# Patient Record
Sex: Female | Born: 1945 | Race: White | Hispanic: No | Marital: Married | Smoking: Current every day smoker
Health system: Southern US, Community
[De-identification: ages and names within clinical notes are randomized; demographics above are authoritative.]

## PROBLEM LIST (undated history)

## (undated) DIAGNOSIS — M069 Rheumatoid arthritis, unspecified: Secondary | ICD-10-CM

## (undated) DIAGNOSIS — Z8639 Personal history of other endocrine, nutritional and metabolic disease: Secondary | ICD-10-CM

## (undated) DIAGNOSIS — M1990 Unspecified osteoarthritis, unspecified site: Secondary | ICD-10-CM

## (undated) DIAGNOSIS — F32A Depression, unspecified: Secondary | ICD-10-CM

## (undated) DIAGNOSIS — K5790 Diverticulosis of intestine, part unspecified, without perforation or abscess without bleeding: Secondary | ICD-10-CM

## (undated) DIAGNOSIS — IMO0001 Reserved for inherently not codable concepts without codable children: Secondary | ICD-10-CM

## (undated) DIAGNOSIS — Z9884 Bariatric surgery status: Secondary | ICD-10-CM

## (undated) DIAGNOSIS — K862 Cyst of pancreas: Secondary | ICD-10-CM

## (undated) DIAGNOSIS — J449 Chronic obstructive pulmonary disease, unspecified: Secondary | ICD-10-CM

## (undated) DIAGNOSIS — H269 Unspecified cataract: Secondary | ICD-10-CM

## (undated) DIAGNOSIS — I10 Essential (primary) hypertension: Secondary | ICD-10-CM

## (undated) DIAGNOSIS — F17200 Nicotine dependence, unspecified, uncomplicated: Secondary | ICD-10-CM

## (undated) DIAGNOSIS — G709 Myoneural disorder, unspecified: Secondary | ICD-10-CM

## (undated) DIAGNOSIS — H9190 Unspecified hearing loss, unspecified ear: Secondary | ICD-10-CM

## (undated) DIAGNOSIS — D179 Benign lipomatous neoplasm, unspecified: Secondary | ICD-10-CM

## (undated) DIAGNOSIS — Z905 Acquired absence of kidney: Secondary | ICD-10-CM

## (undated) DIAGNOSIS — K219 Gastro-esophageal reflux disease without esophagitis: Secondary | ICD-10-CM

## (undated) DIAGNOSIS — F329 Major depressive disorder, single episode, unspecified: Secondary | ICD-10-CM

## (undated) HISTORY — DX: Reserved for inherently not codable concepts without codable children: IMO0001

## (undated) HISTORY — DX: Gastro-esophageal reflux disease without esophagitis: K21.9

## (undated) HISTORY — DX: Acquired absence of kidney: Z90.5

## (undated) HISTORY — DX: Major depressive disorder, single episode, unspecified: F32.9

## (undated) HISTORY — DX: Rheumatoid arthritis, unspecified: M06.9

## (undated) HISTORY — DX: Essential (primary) hypertension: I10

## (undated) HISTORY — DX: Bariatric surgery status: Z98.84

## (undated) HISTORY — DX: Nicotine dependence, unspecified, uncomplicated: F17.200

## (undated) HISTORY — DX: Unspecified osteoarthritis, unspecified site: M19.90

## (undated) HISTORY — DX: Unspecified hearing loss, unspecified ear: H91.90

## (undated) HISTORY — DX: Unspecified cataract: H26.9

## (undated) HISTORY — PX: EYE SURGERY: SHX253

## (undated) HISTORY — DX: Myoneural disorder, unspecified: G70.9

## (undated) HISTORY — DX: Benign lipomatous neoplasm, unspecified: D17.9

## (undated) HISTORY — DX: Depression, unspecified: F32.A

## (undated) HISTORY — DX: Cyst of pancreas: K86.2

## (undated) HISTORY — DX: Personal history of other endocrine, nutritional and metabolic disease: Z86.39

## (undated) HISTORY — DX: Diverticulosis of intestine, part unspecified, without perforation or abscess without bleeding: K57.90

---

## 1971-06-15 HISTORY — PX: ABDOMINAL HYSTERECTOMY: SHX81

## 1975-06-15 DIAGNOSIS — Z905 Acquired absence of kidney: Secondary | ICD-10-CM

## 1975-06-15 HISTORY — PX: NEPHRECTOMY: SHX65

## 1975-06-15 HISTORY — DX: Acquired absence of kidney: Z90.5

## 1997-12-06 ENCOUNTER — Other Ambulatory Visit: Admission: RE | Admit: 1997-12-06 | Discharge: 1997-12-06 | Payer: Self-pay | Admitting: Obstetrics and Gynecology

## 1999-12-04 ENCOUNTER — Encounter: Admission: RE | Admit: 1999-12-04 | Discharge: 1999-12-04 | Payer: Self-pay | Admitting: Obstetrics and Gynecology

## 1999-12-04 ENCOUNTER — Encounter: Payer: Self-pay | Admitting: Obstetrics and Gynecology

## 2000-07-19 ENCOUNTER — Other Ambulatory Visit: Admission: RE | Admit: 2000-07-19 | Discharge: 2000-07-19 | Payer: Self-pay | Admitting: Obstetrics and Gynecology

## 2000-07-27 ENCOUNTER — Encounter: Payer: Self-pay | Admitting: Family Medicine

## 2000-07-27 ENCOUNTER — Ambulatory Visit (HOSPITAL_COMMUNITY): Admission: RE | Admit: 2000-07-27 | Discharge: 2000-07-27 | Payer: Self-pay | Admitting: Family Medicine

## 2001-01-29 ENCOUNTER — Emergency Department (HOSPITAL_COMMUNITY): Admission: EM | Admit: 2001-01-29 | Discharge: 2001-01-30 | Payer: Self-pay

## 2001-12-12 ENCOUNTER — Ambulatory Visit (HOSPITAL_COMMUNITY): Admission: RE | Admit: 2001-12-12 | Discharge: 2001-12-12 | Payer: Self-pay

## 2001-12-12 ENCOUNTER — Emergency Department (HOSPITAL_COMMUNITY): Admission: EM | Admit: 2001-12-12 | Discharge: 2001-12-12 | Payer: Self-pay | Admitting: Emergency Medicine

## 2001-12-29 ENCOUNTER — Encounter: Admission: RE | Admit: 2001-12-29 | Discharge: 2001-12-29 | Payer: Self-pay

## 2003-04-03 ENCOUNTER — Encounter: Payer: Self-pay | Admitting: Obstetrics and Gynecology

## 2003-04-03 ENCOUNTER — Encounter: Admission: RE | Admit: 2003-04-03 | Discharge: 2003-04-03 | Payer: Self-pay | Admitting: Obstetrics and Gynecology

## 2003-04-10 ENCOUNTER — Emergency Department (HOSPITAL_COMMUNITY): Admission: EM | Admit: 2003-04-10 | Discharge: 2003-04-11 | Payer: Self-pay | Admitting: Emergency Medicine

## 2003-06-15 DIAGNOSIS — Z8639 Personal history of other endocrine, nutritional and metabolic disease: Secondary | ICD-10-CM

## 2003-06-15 HISTORY — DX: Personal history of other endocrine, nutritional and metabolic disease: Z86.39

## 2003-12-11 ENCOUNTER — Emergency Department (HOSPITAL_COMMUNITY): Admission: EM | Admit: 2003-12-11 | Discharge: 2003-12-11 | Payer: Self-pay | Admitting: Emergency Medicine

## 2003-12-13 ENCOUNTER — Ambulatory Visit (HOSPITAL_COMMUNITY): Admission: RE | Admit: 2003-12-13 | Discharge: 2003-12-13 | Payer: Self-pay | Admitting: Gastroenterology

## 2004-06-05 ENCOUNTER — Ambulatory Visit: Payer: Self-pay | Admitting: Gastroenterology

## 2005-05-03 ENCOUNTER — Ambulatory Visit: Payer: Self-pay | Admitting: Cardiology

## 2005-05-18 ENCOUNTER — Ambulatory Visit (HOSPITAL_COMMUNITY): Admission: RE | Admit: 2005-05-18 | Discharge: 2005-05-18 | Payer: Self-pay

## 2005-05-28 ENCOUNTER — Ambulatory Visit: Payer: Self-pay

## 2005-06-16 ENCOUNTER — Ambulatory Visit: Payer: Self-pay | Admitting: Cardiology

## 2005-07-12 ENCOUNTER — Ambulatory Visit: Payer: Self-pay | Admitting: Gastroenterology

## 2005-08-04 ENCOUNTER — Ambulatory Visit: Payer: Self-pay

## 2005-09-02 ENCOUNTER — Inpatient Hospital Stay: Payer: Self-pay | Admitting: Psychiatry

## 2005-12-14 ENCOUNTER — Ambulatory Visit (HOSPITAL_COMMUNITY): Admission: RE | Admit: 2005-12-14 | Discharge: 2005-12-14 | Payer: Self-pay

## 2006-02-01 ENCOUNTER — Ambulatory Visit: Payer: Self-pay | Admitting: Gastroenterology

## 2006-02-03 ENCOUNTER — Ambulatory Visit: Payer: Self-pay | Admitting: Gastroenterology

## 2006-02-03 ENCOUNTER — Encounter (INDEPENDENT_AMBULATORY_CARE_PROVIDER_SITE_OTHER): Payer: Self-pay | Admitting: Specialist

## 2006-02-17 ENCOUNTER — Ambulatory Visit (HOSPITAL_COMMUNITY): Admission: RE | Admit: 2006-02-17 | Discharge: 2006-02-17 | Payer: Self-pay | Admitting: Gastroenterology

## 2006-02-22 ENCOUNTER — Ambulatory Visit: Payer: Self-pay | Admitting: Gastroenterology

## 2006-03-15 ENCOUNTER — Ambulatory Visit: Payer: Self-pay | Admitting: Gastroenterology

## 2006-04-13 ENCOUNTER — Ambulatory Visit: Payer: Self-pay | Admitting: Gastroenterology

## 2007-06-15 HISTORY — PX: GASTRIC BYPASS: SHX52

## 2008-03-25 ENCOUNTER — Ambulatory Visit: Payer: Self-pay | Admitting: Cardiology

## 2008-03-26 ENCOUNTER — Encounter: Payer: Self-pay | Admitting: Cardiology

## 2008-03-26 ENCOUNTER — Ambulatory Visit: Payer: Self-pay | Admitting: Surgery

## 2008-03-26 ENCOUNTER — Inpatient Hospital Stay (HOSPITAL_COMMUNITY): Admission: EM | Admit: 2008-03-26 | Discharge: 2008-03-27 | Payer: Self-pay | Admitting: Emergency Medicine

## 2008-03-28 ENCOUNTER — Ambulatory Visit: Payer: Self-pay

## 2008-04-12 ENCOUNTER — Ambulatory Visit: Payer: Self-pay | Admitting: Cardiology

## 2008-05-20 ENCOUNTER — Ambulatory Visit: Admission: RE | Admit: 2008-05-20 | Discharge: 2008-05-20 | Payer: Self-pay | Admitting: Cardiology

## 2008-10-14 ENCOUNTER — Ambulatory Visit (HOSPITAL_COMMUNITY): Admission: RE | Admit: 2008-10-14 | Discharge: 2008-10-14 | Payer: Self-pay | Admitting: Surgery

## 2008-10-18 DIAGNOSIS — R079 Chest pain, unspecified: Secondary | ICD-10-CM | POA: Insufficient documentation

## 2008-10-18 DIAGNOSIS — I10 Essential (primary) hypertension: Secondary | ICD-10-CM | POA: Insufficient documentation

## 2008-10-18 DIAGNOSIS — Z905 Acquired absence of kidney: Secondary | ICD-10-CM | POA: Insufficient documentation

## 2008-10-18 DIAGNOSIS — Z8639 Personal history of other endocrine, nutritional and metabolic disease: Secondary | ICD-10-CM | POA: Insufficient documentation

## 2008-10-22 ENCOUNTER — Ambulatory Visit (HOSPITAL_COMMUNITY): Admission: RE | Admit: 2008-10-22 | Discharge: 2008-10-22 | Payer: Self-pay | Admitting: Surgery

## 2008-11-06 ENCOUNTER — Encounter: Admission: RE | Admit: 2008-11-06 | Discharge: 2008-11-06 | Payer: Self-pay | Admitting: Surgery

## 2009-05-01 ENCOUNTER — Encounter: Admission: RE | Admit: 2009-05-01 | Discharge: 2009-06-11 | Payer: Self-pay | Admitting: Surgery

## 2009-05-13 ENCOUNTER — Inpatient Hospital Stay (HOSPITAL_COMMUNITY): Admission: RE | Admit: 2009-05-13 | Discharge: 2009-05-20 | Payer: Self-pay | Admitting: Surgery

## 2009-05-14 ENCOUNTER — Ambulatory Visit: Payer: Self-pay | Admitting: Vascular Surgery

## 2009-05-14 ENCOUNTER — Encounter (INDEPENDENT_AMBULATORY_CARE_PROVIDER_SITE_OTHER): Payer: Self-pay | Admitting: Surgery

## 2009-06-14 DIAGNOSIS — K862 Cyst of pancreas: Secondary | ICD-10-CM

## 2009-06-14 DIAGNOSIS — D179 Benign lipomatous neoplasm, unspecified: Secondary | ICD-10-CM

## 2009-06-14 HISTORY — DX: Benign lipomatous neoplasm, unspecified: D17.9

## 2009-06-14 HISTORY — DX: Cyst of pancreas: K86.2

## 2009-07-31 ENCOUNTER — Encounter: Admission: RE | Admit: 2009-07-31 | Discharge: 2009-07-31 | Payer: Self-pay | Admitting: Surgery

## 2009-11-03 ENCOUNTER — Encounter: Admission: RE | Admit: 2009-11-03 | Discharge: 2009-11-03 | Payer: Self-pay | Admitting: Surgery

## 2009-12-23 ENCOUNTER — Inpatient Hospital Stay (HOSPITAL_COMMUNITY): Admission: AD | Admit: 2009-12-23 | Discharge: 2009-12-24 | Payer: Self-pay | Admitting: Surgery

## 2009-12-23 ENCOUNTER — Encounter: Payer: Self-pay | Admitting: Emergency Medicine

## 2010-06-29 ENCOUNTER — Encounter
Admission: RE | Admit: 2010-06-29 | Discharge: 2010-07-14 | Payer: Self-pay | Source: Home / Self Care | Attending: Surgery | Admitting: Surgery

## 2010-08-30 LAB — CBC
HCT: 40.7 % (ref 36.0–46.0)
Hemoglobin: 12.1 g/dL (ref 12.0–15.0)
MCH: 29.3 pg (ref 26.0–34.0)
MCV: 86.3 fL (ref 78.0–100.0)
Platelets: 159 10*3/uL (ref 150–400)
Platelets: 167 10*3/uL (ref 150–400)
RBC: 4.71 MIL/uL (ref 3.87–5.11)
RBC: 4.15 MIL/uL (ref 3.87–5.11)
WBC: 6.6 10*3/uL (ref 4.0–10.5)

## 2010-08-30 LAB — DIFFERENTIAL
Basophils Absolute: 0.1 10*3/uL (ref 0.0–0.1)
Basophils Relative: 1 % (ref 0–1)
Lymphocytes Relative: 9 % — ABNORMAL LOW (ref 12–46)
Lymphocytes Relative: 28 % (ref 12–46)
Lymphs Abs: 1.8 10*3/uL (ref 0.7–4.0)
Monocytes Absolute: 0.4 10*3/uL (ref 0.1–1.0)
Monocytes Relative: 7 % (ref 3–12)
Neutro Abs: 8.6 10*3/uL — ABNORMAL HIGH (ref 1.7–7.7)
Neutro Abs: 4.1 10*3/uL (ref 1.7–7.7)
Neutrophils Relative %: 86 % — ABNORMAL HIGH (ref 43–77)
Neutrophils Relative %: 63 % (ref 43–77)

## 2010-08-30 LAB — COMPREHENSIVE METABOLIC PANEL
Albumin: 3.9 g/dL (ref 3.5–5.2)
BUN: 18 mg/dL (ref 6–23)
Chloride: 96 mEq/L (ref 96–112)
Creatinine, Ser: 0.95 mg/dL (ref 0.4–1.2)
GFR calc non Af Amer: 59 mL/min — ABNORMAL LOW (ref >60)
Glucose, Bld: 154 mg/dL — ABNORMAL HIGH (ref 70–99)
Total Bilirubin: 0.6 mg/dL (ref 0.3–1.2)

## 2010-08-30 LAB — URINALYSIS, ROUTINE W REFLEX MICROSCOPIC
Glucose, UA: NEGATIVE mg/dL
Hgb urine dipstick: NEGATIVE
Ketones, ur: 15 mg/dL — AB
Protein, ur: 30 mg/dL — AB

## 2010-08-30 LAB — URINE MICROSCOPIC-ADD ON

## 2010-08-30 LAB — LIPASE, BLOOD: Lipase: 26 U/L (ref 11–59)

## 2010-08-30 LAB — URINE CULTURE

## 2010-09-15 LAB — GLUCOSE, CAPILLARY
Glucose-Capillary: 126 mg/dL — ABNORMAL HIGH (ref 70–99)
Glucose-Capillary: 133 mg/dL — ABNORMAL HIGH (ref 70–99)
Glucose-Capillary: 133 mg/dL — ABNORMAL HIGH (ref 70–99)
Glucose-Capillary: 133 mg/dL — ABNORMAL HIGH (ref 70–99)
Glucose-Capillary: 135 mg/dL — ABNORMAL HIGH (ref 70–99)
Glucose-Capillary: 140 mg/dL — ABNORMAL HIGH (ref 70–99)
Glucose-Capillary: 142 mg/dL — ABNORMAL HIGH (ref 70–99)
Glucose-Capillary: 142 mg/dL — ABNORMAL HIGH (ref 70–99)
Glucose-Capillary: 146 mg/dL — ABNORMAL HIGH (ref 70–99)
Glucose-Capillary: 146 mg/dL — ABNORMAL HIGH (ref 70–99)
Glucose-Capillary: 147 mg/dL — ABNORMAL HIGH (ref 70–99)
Glucose-Capillary: 152 mg/dL — ABNORMAL HIGH (ref 70–99)
Glucose-Capillary: 153 mg/dL — ABNORMAL HIGH (ref 70–99)
Glucose-Capillary: 156 mg/dL — ABNORMAL HIGH (ref 70–99)
Glucose-Capillary: 156 mg/dL — ABNORMAL HIGH (ref 70–99)
Glucose-Capillary: 157 mg/dL — ABNORMAL HIGH (ref 70–99)
Glucose-Capillary: 157 mg/dL — ABNORMAL HIGH (ref 70–99)
Glucose-Capillary: 158 mg/dL — ABNORMAL HIGH (ref 70–99)
Glucose-Capillary: 164 mg/dL — ABNORMAL HIGH (ref 70–99)
Glucose-Capillary: 169 mg/dL — ABNORMAL HIGH (ref 70–99)

## 2010-09-15 LAB — DIFFERENTIAL
Basophils Absolute: 0 10*3/uL (ref 0.0–0.1)
Basophils Absolute: 0 10*3/uL (ref 0.0–0.1)
Basophils Absolute: 0 10*3/uL (ref 0.0–0.1)
Basophils Relative: 0 % (ref 0–1)
Basophils Relative: 0 % (ref 0–1)
Eosinophils Absolute: 0 10*3/uL (ref 0.0–0.7)
Eosinophils Absolute: 0.1 10*3/uL (ref 0.0–0.7)
Lymphocytes Relative: 12 % (ref 12–46)
Lymphocytes Relative: 6 % — ABNORMAL LOW (ref 12–46)
Lymphocytes Relative: 6 % — ABNORMAL LOW (ref 12–46)
Lymphs Abs: 0.5 10*3/uL — ABNORMAL LOW (ref 0.7–4.0)
Lymphs Abs: 0.6 10*3/uL — ABNORMAL LOW (ref 0.7–4.0)
Lymphs Abs: 1.1 10*3/uL (ref 0.7–4.0)
Monocytes Absolute: 0.5 10*3/uL (ref 0.1–1.0)
Monocytes Absolute: 0.5 10*3/uL (ref 0.1–1.0)
Monocytes Relative: 6 % (ref 3–12)
Monocytes Relative: 8 % (ref 3–12)
Neutro Abs: 6.2 10*3/uL (ref 1.7–7.7)
Neutro Abs: 8.7 10*3/uL — ABNORMAL HIGH (ref 1.7–7.7)
Neutro Abs: 9 10*3/uL — ABNORMAL HIGH (ref 1.7–7.7)
Neutrophils Relative %: 77 % (ref 43–77)
Neutrophils Relative %: 83 % — ABNORMAL HIGH (ref 43–77)
Neutrophils Relative %: 89 % — ABNORMAL HIGH (ref 43–77)
Neutrophils Relative %: 90 % — ABNORMAL HIGH (ref 43–77)

## 2010-09-15 LAB — BASIC METABOLIC PANEL
BUN: 3 mg/dL — ABNORMAL LOW (ref 6–23)
BUN: 6 mg/dL (ref 6–23)
CO2: 31 mEq/L (ref 19–32)
CO2: 33 mEq/L — ABNORMAL HIGH (ref 19–32)
Calcium: 8.4 mg/dL (ref 8.4–10.5)
Calcium: 9.1 mg/dL (ref 8.4–10.5)
Chloride: 97 mEq/L (ref 96–112)
Chloride: 99 mEq/L (ref 96–112)
Creatinine, Ser: 0.62 mg/dL (ref 0.4–1.2)
Creatinine, Ser: 0.69 mg/dL (ref 0.4–1.2)
Creatinine, Ser: 0.71 mg/dL (ref 0.4–1.2)
GFR calc Af Amer: >60 mL/min (ref >60)
GFR calc Af Amer: >60 mL/min (ref >60)
GFR calc non Af Amer: >60 mL/min (ref >60)
GFR calc non Af Amer: >60 mL/min (ref >60)
Glucose, Bld: 164 mg/dL — ABNORMAL HIGH (ref 70–99)
Glucose, Bld: 175 mg/dL — ABNORMAL HIGH (ref 70–99)
Potassium: 3.4 mEq/L — ABNORMAL LOW (ref 3.5–5.1)
Potassium: 3.6 mEq/L (ref 3.5–5.1)
Sodium: 135 mEq/L (ref 135–145)

## 2010-09-15 LAB — CBC
HCT: 37.2 % (ref 36.0–46.0)
Hemoglobin: 12.5 g/dL (ref 12.0–15.0)
Hemoglobin: 13.2 g/dL (ref 12.0–15.0)
MCHC: 31.9 g/dL (ref 30.0–36.0)
MCHC: 32.1 g/dL (ref 30.0–36.0)
MCHC: 32.1 g/dL (ref 30.0–36.0)
MCHC: 32.3 g/dL (ref 30.0–36.0)
MCV: 84.5 fL (ref 78.0–100.0)
MCV: 85.2 fL (ref 78.0–100.0)
MCV: 85.5 fL (ref 78.0–100.0)
Platelets: 176 10*3/uL (ref 150–400)
Platelets: 180 10*3/uL (ref 150–400)
Platelets: 188 10*3/uL (ref 150–400)
Platelets: 191 10*3/uL (ref 150–400)
RBC: 4.32 MIL/uL (ref 3.87–5.11)
RBC: 4.4 MIL/uL (ref 3.87–5.11)
RBC: 4.8 MIL/uL (ref 3.87–5.11)
RDW: 15.7 % — ABNORMAL HIGH (ref 11.5–15.5)
RDW: 15.8 % — ABNORMAL HIGH (ref 11.5–15.5)
RDW: 16 % — ABNORMAL HIGH (ref 11.5–15.5)
RDW: 16.3 % — ABNORMAL HIGH (ref 11.5–15.5)
WBC: 4.1 10*3/uL (ref 4.0–10.5)
WBC: 4.7 10*3/uL (ref 4.0–10.5)
WBC: 9.6 10*3/uL (ref 4.0–10.5)
WBC: 9.7 10*3/uL (ref 4.0–10.5)

## 2010-09-15 LAB — COMPREHENSIVE METABOLIC PANEL
ALT: 54 U/L — ABNORMAL HIGH (ref 0–35)
Albumin: 3.1 g/dL — ABNORMAL LOW (ref 3.5–5.2)
Calcium: 8.3 mg/dL — ABNORMAL LOW (ref 8.4–10.5)
Glucose, Bld: 166 mg/dL — ABNORMAL HIGH (ref 70–99)
Sodium: 132 mEq/L — ABNORMAL LOW (ref 135–145)
Total Protein: 6.5 g/dL (ref 6.0–8.3)

## 2010-09-15 LAB — HEMOGLOBIN AND HEMATOCRIT, BLOOD: HCT: 38.5 % (ref 36.0–46.0)

## 2010-09-15 LAB — AMYLASE: Amylase: 30 U/L (ref 0–105)

## 2010-09-16 LAB — CBC
HCT: 42.4 % (ref 36.0–46.0)
Hemoglobin: 13.9 g/dL (ref 12.0–15.0)
MCV: 84 fL (ref 78.0–100.0)
Platelets: 215 10*3/uL (ref 150–400)
RBC: 5.05 MIL/uL (ref 3.87–5.11)
WBC: 7.6 10*3/uL (ref 4.0–10.5)

## 2010-09-16 LAB — COMPREHENSIVE METABOLIC PANEL
Albumin: 3.8 g/dL (ref 3.5–5.2)
Alkaline Phosphatase: 35 U/L — ABNORMAL LOW (ref 39–117)
BUN: 21 mg/dL (ref 6–23)
CO2: 34 mEq/L — ABNORMAL HIGH (ref 19–32)
Chloride: 100 mEq/L (ref 96–112)
Creatinine, Ser: 1 mg/dL (ref 0.4–1.2)
GFR calc non Af Amer: 56 mL/min — ABNORMAL LOW (ref >60)
Glucose, Bld: 134 mg/dL — ABNORMAL HIGH (ref 70–99)
Potassium: 4.9 mEq/L (ref 3.5–5.1)
Total Bilirubin: 0.7 mg/dL (ref 0.3–1.2)

## 2010-09-16 LAB — DIFFERENTIAL
Basophils Absolute: 0 10*3/uL (ref 0.0–0.1)
Basophils Relative: 1 % (ref 0–1)
Lymphocytes Relative: 15 % (ref 12–46)
Monocytes Absolute: 0.5 10*3/uL (ref 0.1–1.0)
Neutro Abs: 5.7 10*3/uL (ref 1.7–7.7)

## 2010-09-16 LAB — HEMOGLOBIN AND HEMATOCRIT, BLOOD: Hemoglobin: 14.1 g/dL (ref 12.0–15.0)

## 2010-09-16 LAB — GLUCOSE, CAPILLARY
Glucose-Capillary: 111 mg/dL — ABNORMAL HIGH (ref 70–99)
Glucose-Capillary: 159 mg/dL — ABNORMAL HIGH (ref 70–99)
Glucose-Capillary: 167 mg/dL — ABNORMAL HIGH (ref 70–99)
Glucose-Capillary: 177 mg/dL — ABNORMAL HIGH (ref 70–99)

## 2010-10-19 ENCOUNTER — Encounter (INDEPENDENT_AMBULATORY_CARE_PROVIDER_SITE_OTHER): Payer: Self-pay | Admitting: Surgery
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
## 2010-10-27 NOTE — Assessment & Plan Note (Signed)
[facility]                            CARDIOLOGY OFFICE NOTE   NARGIS, ABRAMS                         MRN:          [id number]  DATE:04/12/2008                            DOB:          February 03, 1946    Ms. Smeal is a very pleasant 65 year old female who was recently  admitted to [facility] [place] with complaints of chest heaviness,  productive cough, and dyspnea.  During that admission, she did have a  chest x-ray on March 26, 2008.  There was no acute disease noted.  She  had a D-dimer that was normal at 0.26.  She had cardiac markers that  were negative.  She also had a BNP that was less than 30.  An  echocardiogram performed on March 26, 2008 showed normal LV function.  She had a followup Myoview performed on March 28, 2008.  At that time,  her ejection fraction was 77% and there was no scar or ischemia.  Note,  at the time of her admission, it was felt that she most likely had  bronchitis.  However, since discharge, she continues to have some  dyspnea on exertion.  It occurs with moderate activities.  It does not  occur at rest.  Note, there is no orthopnea or PND, but she occasionally  has pedal edema on the left.  She has not had chest pain, cough, or  fevers or chills.   Her medications include:  1. Enalapril 10 mg p.o. daily.  2. Norvasc 5 mg p.o. daily.  3. Ginkgo bulba.  4. Actos plus.  5. Glimepiride.  6. Levemir.  7. Calcium.  8. ReQuip.  9. Cymbalta.  10.Aspirin 81 mg p.o. daily.   PHYSICAL EXAMINATION:  VITAL SIGNS:  Today, shows blood pressure of  140/90 and her pulse is 80.  HEENT:  Normal.  NECK:  Supple.  CHEST:  Clear.  CARDIOVASCULAR:  Regular rate and rhythm.  ABDOMEN:  No tenderness.  EXTREMITIES:  No edema.   DIAGNOSES:  1. Dyspnea - etiology of this is unclear to me.  However, I am not      convinced that is cardiac.  She had an echocardiogram that showed      normal left ventricular function and  Myoview that showed no      ischemia or infarction.  She also had a normal D-dimer as well as a      normal BNP.  We will schedule her to have pulmonary function tests.      If they are abnormal, then we may need to refer to a pulmonologist.      I will see back in 3 months to review.  2. Hypertension - her blood pressure is mildly elevated.  We will      track this and increase her angiotensin-converting enzyme inhibitor      as indicated.  3. History of hyperlipidemia - management per primary care physician.  4. Diabetes mellitus.   We will see her back in 3 months.     Madolyn Frieze Jens Som, MD, [facility]  Electronically Signed  BSC/MedQ  DD: 04/12/2008  DT: 04/12/2008  Job #: [id number]   cc:   Janetta Hora. Darrick Penna, MD

## 2010-10-27 NOTE — Discharge Summary (Signed)
NAME:  [name] [name], [name] [name]                  ACCOUNT NO.:  [id number]   MEDICAL RECORD NO.:  [id number]          PATIENT TYPE:  INP   LOCATION:  3731                         FACILITY:  [facility]   PHYSICIAN:  Madolyn Frieze. Jens Som, MD, FACCDATE OF BIRTH:  [birth year]   DATE OF ADMISSION:  03/25/2008  DATE OF DISCHARGE:  03/27/2008                               DISCHARGE SUMMARY   PRIMARY CARDIOLOGIST:  Madolyn Frieze. Jens Som, MD, [facility].   PRIMARY CARE Yitzchok Carriger:  Dr. Loma Sender.   DISCHARGE DIAGNOSIS:  Chest pain.   SECONDARY DIAGNOSES:  1. Acute bronchitis.  2. Hypertension.  3. Hyperlipidemia.  4. Type 2 diabetes mellitus.  5. Morbid obesity.  6. History of left catheterization and ankle tenderness and swelling      with negative ultrasound for deep vein thrombosis on this      admission.   ALLERGIES:  MORPHINE.   PROCEDURES:  Two-D echocardiogram performed on March 26, 2008, showing  an ejection fraction of 60% without regional wall motion abnormalities  and no evidence of pericardial effusion.  Pericardium had normal  appearance.   HISTORY OF PRESENT ILLNESS:  A 65 year old Caucasian female without  prior cardiac history.  She presented to the [facility] ED on March 26, 2008, following a 1-week history of constant chest heaviness and  pressure that was worst with lying down, better with sitting up, and  worst with activity.  She also had a productive cough with green/yellow  sputum and dyspnea on exertion.  In the emergency room, ECG showed no  acute changes and chest x-ray was normal.  She was admitted for further  evaluation and rule out.   HOSPITAL COURSE:  We had a high suspicion for bronchitis and the patient  was placed on azithromycin therapy.  Her chest pain which had been  present for a week resolved after a dose of Toradol and her cardiac  markers remained negative.  Because she had multiple risk factors, we  did obtain a 2-D echocardiogram to evaluate wall motion and  also rule  out pericarditis.  Echo showed normal LV function without wall motion  abnormalities with normal pericardium and no effusion.  This being the  case, we have arranged for her to be discharged today.  Further, we have  arranged for her to undergo an adenosine Myoview on April 02, 2008, at  8:45 a.m. in our office.   DISCHARGE LABORATORY DATA:  Hemoglobin 12.2, hematocrit 37.0, WBC 5.6,  and platelets 195.  Sodium 139, potassium 3.7, chloride 104, CO2 28, BUN  23, creatinine 1.01, and glucose 186.  Total bilirubin 0.5, alkaline  phosphatase 42, AST 16, ALT 24, total protein 6.1, albumin 3.4, and  calcium 8.9.  CK 47, MB 1.1, and troponin I less than 0.01.  Total  cholesterol 129, triglycerides 96, HDL 42, LDL 68, TSH 2.507.   DISPOSITION:  The patient will be discharged home today in good  condition.   FOLLOWUP PLANS AND APPOINTMENTS:  She has a followup adenosine Myoview  on April 02, 2008, at 8:45 a.m.  She will follow up  with Dr. Jens Som  on April 15, 2008, at 8:30 a.m.  She is asked to follow up with Dr.  Vear Clock in the next couple of weeks for followup bronchitis.   DISCHARGE MEDICATIONS:  1. Aspirin 81 mg daily.  2. Enalapril 10 mg daily.  3. Amlodipine 5 mg daily.  4. Cymbalta 20 mg daily.  5. Ropinirole 0.5 mg daily.  6. Actoplus Met 15/850 mg daily.  7. Glimepiride 2 mg b.i.d.  8. Simvastatin 40 mg daily, to be held until Zithromax therapy      completes.  9. Zithromax 250 mg daily x3 additional days.  10.Levemir 20 units nightly.  11.Symlin as previously prescribed.  12.Ibuprofen 600 mg p.o. q.6 h. p.r.n. chest discomfort.   OUTSTANDING LABORATORY STUDIES:  Adenosine Myoview is pending on April 02, 2008.   DURATION OF DISCHARGE/ENCOUNTER:  Sixty minutes including physician  time.      [name] [name], ANP      Madolyn Frieze. Jens Som, MD, [facility]  Electronically Signed    CB/MEDQ  D:  03/27/2008  T:  03/28/2008  Job:  [id number]   cc:    Loma Sender

## 2010-10-30 NOTE — Assessment & Plan Note (Signed)
[facility]                           GASTROENTEROLOGY OFFICE NOTE   RANIYAH, CURENTON                         MRN:          [id number]  DATE:02/22/2006                            DOB:          03/26/1946    PROBLEM:  Nausea.   REASON:  Mrs. Emrich has returned still complaining of abdominal bloating and  nausea.  Upper endoscopy was pertinent for mild distal esophagitis.  This  was dilated to 17 mms.  Question of distal candidiasis esophagitis was  raised.  The biopsies were negative.  Gastric emptying scan demonstrated a  borderline delay gastric emptying with 63% activity at 1-hour (normal <50%)  and 30.5% activity after 2-hours (normal <30%).  Mrs. Duguay does note that  her symptoms coincided with starting Metformin for her diabetes.   PHYSICAL EXAMINATION:  VITAL SIGNS:  Stable.  Weight 176.   IMPRESSION:  Nausea with dyspepsia.  This could be related to borderline  gastroparesis.  I am also suspicious that Metformin could be contributing,  if not causing her symptoms.   RECOMMENDATION:  Hold Metformin for 45-days.  If she is not improved, she  will resume her Metformin and begin Reglan.                                   Barbette Hair. Arlyce Dice, MD,FACG   RDK/MedQ  DD:  02/22/2006  DT:  02/23/2006  Job #:  [id number]   cc:   Loma Sender

## 2010-10-30 NOTE — Assessment & Plan Note (Signed)
[facility]                           GASTROENTEROLOGY OFFICE NOTE   ZEANNA, SUNDE                         MRN:          [id number]  DATE:04/13/2006                            DOB:          1946-06-09    PROBLEM:  Nausea.   Ms. Ceasar has returned for scheduled followup.  Unfortunately, she is  developing worsening nausea again.  This is associated with foul-smelling  eructations and excess flatus.  She is having some epigastric pain as well.   Gastric emptying scan on February 17, 2006, showed borderline to slight  delay in the gastric emptying.   EXAMINATION:  Pulse 80, blood pressure 120/72, weight 175.   IMPRESSION:  Dyspepsia.  I suspect this is due to gastroparesis.   RECOMMENDATIONS:  Trial of Reglan 10 mg one-half hour a.c. and h.s.  If this  is unsuccessful I would consider an empiric course of Xifaxan for possible  bacterial overgrowth.     Barbette Hair. Arlyce Dice, MD,FACG  Electronically Signed    RDK/MedQ  DD: 04/13/2006  DT: 04/13/2006  Job #: [id number]   cc:   Loma Sender

## 2010-10-30 NOTE — Assessment & Plan Note (Signed)
[facility]                           GASTROENTEROLOGY OFFICE NOTE   [name], [name]                         MRN:          [id number]  DATE:02/01/2006                            DOB:          [birth year]    PROBLEMS:  Dysphagia and dyspepsia.   [name] [name] is a 65 year old Caucasian woman with a history of gastric ulcer,  treated with proton pump inhibitor in 2005, and repeat endoscopy showed  healing of the same.  She comes in today with complaints of dysphagia and  dyspepsia and a lot of bloating.  She has been having abnormal bowel  movements as well, about four to five per day.  She has stopped taking her  Prilosec for the last three to four weeks as advised by her primary care  physician.  She has apparently been tried on multiple other stomach  protecting medications but none have worked for her.  Of note, patient has  been recently diagnosed with diabetes in November 2006 and has recently been  started on Metformin, that is March 2007.  Patient does admit that her  symptoms have probably started around the same time as Metformin was  started.  Patient also does complain of some abdominal cramping, especially  in the upper abdomen, associated with her eating habits.  Patient has no  complaints of diarrhea, emesis, hematemesis, melena.  No history of alcohol  abuse.   CURRENT MEDICATIONS:  1. Atenolol 50 mg p.o. daily.  2. Fiber (Metamucil) one scoop daily.  3. Aspirin 81 mg p.o. daily.  4. Glipizide 10 mg p.o. daily.  5. Cymbalta 20 mg p.o. daily.  6. Metformin 1000 mg p.o. b.i.d.  7. Enalapril 5 mg p.o. daily.  8. Januvia 10 mg p.o. daily.   PHYSICAL EXAMINATION:  Temperature:  Afebrile.  Blood pressure:  128/60.  Pulse:  72.  Weight:  179.4, up 2 pounds from last visit.  HEENT:  Extraocular movements intact.  Pupils equal, round, and reactive to  light.  Sclerae nonicteric.  Conjunctivae pink.  NECK:  Supple.  No adenopathy or  thyromegaly.  CHEST:  Clear to auscultation bilaterally.  Air entry equal bilaterally.  No  wheeze or rhonchi.  HEART:  Regular rate and rhythm.  No murmurs, rubs, or gallops.  ABDOMEN:  Soft.  Slightly distended.  Slight tenderness in the right upper  quadrant region.  No masses palpated.  Bowel sounds present.  EXTREMITIES:  No edema, cyanosis, or clubbing.  Pulses 2+ bilaterally.  RECTAL:  Deferred.   IMPRESSION:  Dysphagia and dyspepsia, most likely secondary to peptic ulcer  disease versus gastroesophageal reflux disease versus stricture versus  diabetic gastroparesis versus medication effect, namely Metformin.   RECOMMENDATIONS:  1. Endoscopy.  2. If endoscopy negative, then to proceed for gastric emptying studies.  3. If the above two are negative, then may consider stopping Metformin for      a couple of days and see if symptoms alleviate.   Dr. Arlyce Dice has reviewed the above and agrees with the same.  Yetta Barre, MD                                Barbette Hair. Arlyce Dice, MD, [facility]   SS/MedQ  DD:  02/01/2006  DT:  02/02/2006  Job #:  [id number]

## 2010-10-30 NOTE — Assessment & Plan Note (Signed)
[facility]                           GASTROENTEROLOGY OFFICE NOTE   [name], [name]                         MRN:          [id number]  DATE:03/15/2006                            DOB:          April 17, 1946    PROBLEM:  Nausea.   [name] [name] has returned for scheduled GI followup.  Since holding her  metformin she reports significant improvement in the nausea and dysacousia.  She is now on Byetta and lower-dose metformin and has only minimal nausea.  She also has occasional discomfort in the right upper quadrant.  Altogether,  she is feeling significantly improved.   EXAMINATION:  Pulse 70, blood pressure 120/60, weight 178.   IMPRESSION:  1. Nausea - likely secondary to metformin.  2. Nonspecific minimal abdominal discomfort.   RECOMMENDATIONS:  1. No further GI workup.  2. NuLev 0.25 mg sublingual q.4h. p.r.n. for abdominal pain.       Barbette Hair. Arlyce Dice, MD,FACG      RDK/MedQ  DD:  03/15/2006  DT:  03/16/2006  Job #:  [id number]   cc:   Loma Sender

## 2011-03-15 LAB — CARDIAC PANEL(CRET KIN+CKTOT+MB+TROPI)
CK, MB: 1.1
CK, MB: 1.2
Relative Index: INVALID
Total CK: 51

## 2011-03-15 LAB — COMPREHENSIVE METABOLIC PANEL
Albumin: 3.4 — ABNORMAL LOW
Alkaline Phosphatase: 42
BUN: 23
Calcium: 8.9
Creatinine, Ser: 1.01
Potassium: 3.7
Total Protein: 6.1

## 2011-03-15 LAB — PROTIME-INR: INR: 0.9

## 2011-03-15 LAB — APTT: aPTT: 33

## 2011-03-15 LAB — DIFFERENTIAL
Basophils Relative: 0
Lymphs Abs: 0.6 — ABNORMAL LOW
Monocytes Relative: 1 — ABNORMAL LOW
Neutro Abs: 5
Neutrophils Relative %: 89 — ABNORMAL HIGH

## 2011-03-15 LAB — CBC
RBC: 4.25
WBC: 5.6

## 2011-03-15 LAB — LIPID PANEL
HDL: 42
LDL Cholesterol: 68
Total CHOL/HDL Ratio: 3.1
Triglycerides: 96
VLDL: 19

## 2011-03-15 LAB — GLUCOSE, CAPILLARY
Glucose-Capillary: 163 — ABNORMAL HIGH
Glucose-Capillary: 213 — ABNORMAL HIGH
Glucose-Capillary: 232 — ABNORMAL HIGH
Glucose-Capillary: 264 — ABNORMAL HIGH

## 2011-03-15 LAB — TROPONIN I: Troponin I: 0.02

## 2011-03-15 LAB — CK TOTAL AND CKMB (NOT AT ARMC)
CK, MB: 1.1
Total CK: 64

## 2011-03-15 LAB — POCT I-STAT, CHEM 8
BUN: 15
Calcium, Ion: 1.17
Creatinine, Ser: 0.9
Hemoglobin: 13.3
TCO2: 31

## 2011-06-28 ENCOUNTER — Ambulatory Visit (INDEPENDENT_AMBULATORY_CARE_PROVIDER_SITE_OTHER): Payer: BC Managed Care – PPO | Admitting: Family Medicine

## 2011-06-28 ENCOUNTER — Encounter: Payer: Self-pay | Admitting: Family Medicine

## 2011-06-28 VITALS — BP 142/80 | HR 76 | Temp 98.2°F | Ht 62.5 in | Wt 134.5 lb

## 2011-06-28 DIAGNOSIS — Z87891 Personal history of nicotine dependence: Secondary | ICD-10-CM

## 2011-06-28 DIAGNOSIS — I10 Essential (primary) hypertension: Secondary | ICD-10-CM

## 2011-06-28 DIAGNOSIS — Z23 Encounter for immunization: Secondary | ICD-10-CM

## 2011-06-28 DIAGNOSIS — E119 Type 2 diabetes mellitus without complications: Secondary | ICD-10-CM

## 2011-06-28 DIAGNOSIS — F329 Major depressive disorder, single episode, unspecified: Secondary | ICD-10-CM | POA: Insufficient documentation

## 2011-06-28 DIAGNOSIS — F17200 Nicotine dependence, unspecified, uncomplicated: Secondary | ICD-10-CM | POA: Insufficient documentation

## 2011-06-28 DIAGNOSIS — Z905 Acquired absence of kidney: Secondary | ICD-10-CM

## 2011-06-28 DIAGNOSIS — Z9884 Bariatric surgery status: Secondary | ICD-10-CM
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
## 2011-06-28 NOTE — Assessment & Plan Note (Signed)
Check vitamins when returns for blood work prior to CPE.

## 2011-06-28 NOTE — Progress Notes (Signed)
Subjective:    Patient ID: [name] [name], female    DOB: 1945-08-10, 66 y.o.   MRN: [id number]  HPI CC: new pt medicare, establish  Prior saw Dr. Vear Clock, would like to establish here.  Easy bruising.  Started evening primrose and black currant seed oil 6 mo ago, doesn't think this affected bruising.  Just arms and hands, not legs.  No blood in stool or urine or gums.  Not on blood thinner that she knows.  Diabetes - 2005.  Prior on insulin and other oral antiglycemics.  Then after gastric bypass diet controlled.  Feels stable, off meds, diet controlled.  Unsure last A1c.  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  Medical problems improved since then.  HTN - see ROS.  Compliant with meds.  Smoking - using e cigarette, quit smoking 05/2011.  Prior smoking <1ppd  Preventative: Well woman at OBGYN (Dr. Ambrose Mantle), due 07/2011 Colonoscopy - due in 2 years for rpt.  (10 yrs last done). Last CPE unsure.  Thinks due. Flu - requests today. PNA - 2009 Td - done by dr. Vear Clock, will await records.  Medications and allergies reviewed and updated in chart.  Past histories reviewed and updated if relevant as below. Patient Active Problem List  Diagnoses  . DM  . HYPERTENSION  . CHEST PAIN  . NEPHRECTOMY, HX OF   Past Medical History  Diagnosis Date  . Diabetes mellitus 2005    diet controlled, prior on meds/insulin  . Arthritis   . Depression     off meds  . HTN (hypertension)   . Gastric bypass status for obesity   . History of smoking     quit 05/2011, using e cig   Past Surgical History  Procedure Date  . Eye surgery     2 as a child  . Nephrectomy 1977    right kidney removed, traumatic  . Abdominal hysterectomy 1973    partial, dysmenorrhea, ovaries still remain  . Gastric bypass 2009    roux en y Daphine Deutscher)   History  Substance Use Topics  . Smoking status: Former Smoker    Types: Cigarettes    Quit date: 05/15/2011  . Smokeless tobacco: Never Used   Comment: Uses  electronic cigarettes now  . Alcohol Use: No   Family History  Problem Relation Age of Onset  . Diabetes Mother   . Heart disease Mother     CHF  . Heart disease Brother   . Hypertension Brother   . Coronary artery disease Brother     mild MI  . Cancer Maternal Uncle     leukemia  . Cancer Maternal Grandfather     stomach  . Stroke Neg Hx    Allergies  Allergen Reactions  . Morphine And Related Other (See Comments)    Hallucinations   Current Outpatient Prescriptions on File Prior to Visit  Medication Sig Dispense Refill  . CALCIUM-MAGNESIUM-ZINC PO Take by mouth.        . Cyanocobalamin (B-12 PO) Take 1,000 mg by mouth daily.       . Dietary Management Product (GABADONE PO) Take 30 mg by mouth 2 (two) times daily.       . enalapril (VASOTEC) 5 MG tablet Take 5 mg by mouth daily.        . IRON PO Take 65 mg by mouth daily.        . Multiple Vitamin (MULTIVITAMIN PO) Take by mouth.        Marland Kitchen  rOPINIRole (REQUIP) 1 MG tablet Take 1 mg by mouth at bedtime.       . Biotin (BIOTIN 5000) 5 MG CAPS Take by mouth.         Review of Systems  Constitutional: Negative for fever, chills, activity change, appetite change, fatigue and unexpected weight change.  HENT: Negative for hearing loss and neck pain.   Eyes: Negative for visual disturbance.  Respiratory: Negative for cough, chest tightness, shortness of breath and wheezing.   Cardiovascular: Negative for chest pain, palpitations and leg swelling.  Gastrointestinal: Negative for nausea, vomiting, abdominal pain, diarrhea, constipation, blood in stool and abdominal distention.  Genitourinary: Negative for hematuria and difficulty urinating.  Musculoskeletal: Negative for myalgias and arthralgias.  Skin: Negative for rash.  Neurological: Negative for dizziness, seizures, syncope and headaches.  Hematological: Bruises/bleeds easily.  Psychiatric/Behavioral: Negative for dysphoric mood. The patient is not nervous/anxious.          Objective:   Physical Exam  Nursing note and vitals reviewed. Constitutional: She is oriented to person, place, and time. She appears well-developed and well-nourished. No distress.  HENT:  Head: Normocephalic and atraumatic.  Right Ear: External ear normal.  Left Ear: External ear normal.  Nose: Nose normal.  Mouth/Throat: Oropharynx is clear and moist. No oropharyngeal exudate.  Eyes: Conjunctivae and EOM are normal. Pupils are equal, round, and reactive to light. No scleral icterus.  Neck: Normal range of motion. Neck supple. No thyromegaly present.  Cardiovascular: Normal rate, regular rhythm, normal heart sounds and intact distal pulses.   No murmur heard. Pulses:      Radial pulses are 2+ on the right side, and 2+ on the left side.  Pulmonary/Chest: Effort normal and breath sounds normal. No respiratory distress. She has no wheezes. She has no rales.  Abdominal: Soft. Bowel sounds are normal. She exhibits no distension and no mass. There is no tenderness. There is no rebound and no guarding.  Musculoskeletal: Normal range of motion. She exhibits no edema.  Lymphadenopathy:    She has no cervical adenopathy.  Neurological: She is alert and oriented to person, place, and time.       CN grossly intact, station and gait intact  Skin: Skin is warm and dry. No rash noted.  Psychiatric: She has a normal mood and affect. Her behavior is normal. Judgment and thought content normal.      Assessment & Plan:

## 2011-06-28 NOTE — Patient Instructions (Signed)
Return at your convenience for wellness physical, prior fasting for blood work. Good to see you today, call us with questions. Flu shot today.

## 2011-06-28 NOTE — Assessment & Plan Note (Signed)
Diet controlled since bypass. Continue to monitor.

## 2011-06-28 NOTE — Assessment & Plan Note (Signed)
Good control, continue meds.  Await records.

## 2011-06-28 NOTE — Assessment & Plan Note (Signed)
Encouraged continued abstinence. 

## 2011-09-20 ENCOUNTER — Other Ambulatory Visit: Payer: Self-pay | Admitting: Family Medicine

## 2011-09-20 ENCOUNTER — Other Ambulatory Visit (INDEPENDENT_AMBULATORY_CARE_PROVIDER_SITE_OTHER): Payer: BC Managed Care – PPO

## 2011-09-20 ENCOUNTER — Telehealth: Payer: Self-pay | Admitting: Family Medicine

## 2011-09-20 DIAGNOSIS — E119 Type 2 diabetes mellitus without complications: Secondary | ICD-10-CM

## 2011-09-20 DIAGNOSIS — Z9884 Bariatric surgery status: Secondary | ICD-10-CM

## 2011-09-20 DIAGNOSIS — Z79899 Other long term (current) drug therapy: Secondary | ICD-10-CM

## 2011-09-20 DIAGNOSIS — I10 Essential (primary) hypertension: Secondary | ICD-10-CM

## 2011-09-20 DIAGNOSIS — M109 Gout, unspecified: Secondary | ICD-10-CM | POA: Insufficient documentation

## 2011-09-20 LAB — LIPID PANEL
LDL Cholesterol: 105 mg/dL — ABNORMAL HIGH (ref 0–99)
Total CHOL/HDL Ratio: 4
VLDL: 21.6 mg/dL (ref 0.0–40.0)

## 2011-09-20 LAB — COMPREHENSIVE METABOLIC PANEL
ALT: 13 U/L (ref 0–35)
AST: 15 U/L (ref 0–37)
Albumin: 4 g/dL (ref 3.5–5.2)
Alkaline Phosphatase: 72 U/L (ref 39–117)
Potassium: 3.9 mEq/L (ref 3.5–5.1)
Sodium: 139 mEq/L (ref 135–145)
Total Protein: 7.1 g/dL (ref 6.0–8.3)

## 2011-09-20 LAB — CBC WITH DIFFERENTIAL/PLATELET
Basophils Absolute: 0 10*3/uL (ref 0.0–0.1)
HCT: 43 % (ref 36.0–46.0)
Hemoglobin: 14.3 g/dL (ref 12.0–15.0)
Lymphs Abs: 1.5 10*3/uL (ref 0.7–4.0)
MCV: 88.2 fl (ref 78.0–100.0)
Monocytes Absolute: 0.5 10*3/uL (ref 0.1–1.0)
Neutro Abs: 3.6 10*3/uL (ref 1.4–7.7)
Platelets: 151 10*3/uL (ref 150.0–400.0)
RDW: 12.4 % (ref 11.5–14.6)

## 2011-09-20 LAB — FERRITIN: Ferritin: 41.6 ng/mL (ref 10.0–291.0)

## 2011-09-20 NOTE — Telephone Encounter (Signed)
Opened in error

## 2011-09-23 LAB — HEMOGLOBIN A1C: Hgb A1c MFr Bld: 5.5 % (ref 4.6–6.5)

## 2011-09-27 ENCOUNTER — Encounter: Payer: Self-pay | Admitting: Family Medicine

## 2011-09-27 ENCOUNTER — Ambulatory Visit (INDEPENDENT_AMBULATORY_CARE_PROVIDER_SITE_OTHER): Payer: BC Managed Care – PPO | Admitting: Family Medicine

## 2011-09-27 VITALS — BP 130/80 | HR 70 | Temp 98.5°F | Ht 61.5 in | Wt 132.5 lb

## 2011-09-27 DIAGNOSIS — I10 Essential (primary) hypertension: Secondary | ICD-10-CM

## 2011-09-27 DIAGNOSIS — Z9884 Bariatric surgery status: Secondary | ICD-10-CM

## 2011-09-27 DIAGNOSIS — M7520 Bicipital tendinitis, unspecified shoulder: Secondary | ICD-10-CM

## 2011-09-27 DIAGNOSIS — M7522 Bicipital tendinitis, left shoulder: Secondary | ICD-10-CM | POA: Insufficient documentation

## 2011-09-27 DIAGNOSIS — Z0000 Encounter for general adult medical examination without abnormal findings: Secondary | ICD-10-CM

## 2011-09-27 DIAGNOSIS — F17200 Nicotine dependence, unspecified, uncomplicated: Secondary | ICD-10-CM

## 2011-09-27 DIAGNOSIS — E119 Type 2 diabetes mellitus without complications: Secondary | ICD-10-CM

## 2011-09-27 MED ORDER — B-12 1000 MCG PO CAPS: 1.0000 | ORAL_CAPSULE | ORAL | Status: DC

## 2011-09-27 NOTE — Progress Notes (Signed)
Subjective:    Patient ID: [name] [name], female    DOB: 1945-10-23, 66 y.o.   MRN: [id number]  HPI CC:  CPE  DM - h/o this, resolved after bypass.    2 wks ago at beach, felt soreness in left lateral knee, worsened pain.  Next morning left arm/hand painful, trouble closing hand.  Currently prior sxs resolved but for last 3-4 days having pain that starts left should and travels down to elbow.  Worried about this.  Denies inciting trauma/injury.  No neck pain or stiffness.  Sometimes trouble lifting left arm above head.  Hasn't tried anything for this so far.  Smoking - Back to 1/2 ppd.  Increased stress.  Working on quitting again.  Has E cig at home.  Preventative:  Well woman at OBGYN (Dr. Ambrose Mantle), due next month 10/2011 as well as mammogram end of this month. Colonoscopy - told would be notified when rpt due, done by Dr. Arlyce Dice (normal, good for 10 yrs) Flu - 2013 PNA - 2009  Td - unsure Dexa - thinks had done 2011 by Dr. Lucianne Muss, told normal.hearing - some trouble, uses hearing aides.  Gets checked every 3 months. Vision - overall good.  Gets eyes checked yearly, due in summer.   No falls in last year. Denies mood issues, depression, [place].  Medications and allergies reviewed and updated in chart.  Past histories reviewed and updated if relevant as below. Patient Active Problem List  Diagnoses  . DM  . HYPERTENSION  . NEPHRECTOMY, HX OF  . Depression  . Gastric bypass status for obesity  . History of smoking  . Gout   Past Medical History  Diagnosis Date  . Diabetes mellitus 2005    diet controlled, prior on meds/insulin  . Arthritis   . Depression     off meds  . HTN (hypertension)   . Gastric bypass status for obesity   . History of smoking     quit 05/2011, using e cig   Past Surgical History  Procedure Date  . Eye surgery     2 as a child  . Nephrectomy 1977    right kidney removed, traumatic  . Abdominal hysterectomy 1973    partial, dysmenorrhea,  ovaries still remain  . Gastric bypass 2009    roux en y Daphine Deutscher)   History  Substance Use Topics  . Smoking status: Current Everyday Smoker -- 0.5 packs/day    Types: Cigarettes  . Smokeless tobacco: Never Used   Comment: Uses electronic cigarettes now  . Alcohol Use: No   Family History  Problem Relation Age of Onset  . Diabetes Mother   . Heart disease Mother     CHF  . Heart disease Brother   . Hypertension Brother   . Coronary artery disease Brother     mild MI  . Cancer Maternal Uncle     leukemia  . Cancer Maternal Grandfather     stomach  . Stroke Neg Hx    Allergies  Allergen Reactions  . Morphine And Related Other (See Comments)    Hallucinations   Current Outpatient Prescriptions on File Prior to Visit  Medication Sig Dispense Refill  . Cyanocobalamin (B-12 PO) Take 1,000 mg by mouth daily.       . enalapril (VASOTEC) 5 MG tablet Take 5 mg by mouth daily.        Marland Kitchen gabapentin (NEURONTIN) 300 MG capsule Take 300 mg by mouth 2 (two) times daily.      Marland Kitchen  IRON PO Take 65 mg by mouth daily.        . Multiple Vitamin (MULTIVITAMIN PO) Take by mouth.        Marland Kitchen rOPINIRole (REQUIP) 1 MG tablet Take 1 mg by mouth at bedtime.       Marland Kitchen CALCIUM-MAGNESIUM-ZINC PO Take by mouth.           Review of Systems  Constitutional: Negative for fever, chills, activity change, appetite change, fatigue and unexpected weight change.  HENT: Negative for hearing loss and neck pain.   Eyes: Negative for visual disturbance.  Respiratory: Negative for cough, chest tightness, shortness of breath and wheezing.   Cardiovascular: Negative for chest pain, palpitations and leg swelling.  Gastrointestinal: Negative for nausea, vomiting, abdominal pain, diarrhea, constipation, blood in stool and abdominal distention.  Genitourinary: Negative for hematuria and difficulty urinating.  Musculoskeletal: Negative for myalgias and arthralgias.  Skin: Negative for rash.  Neurological: Negative for  dizziness, seizures, syncope and headaches.  Hematological: Does not bruise/bleed easily.  Psychiatric/Behavioral: Negative for dysphoric mood. The patient is not nervous/anxious.        Objective:   Physical Exam  Nursing note and vitals reviewed. Constitutional: She is oriented to person, place, and time. She appears well-developed and well-nourished. No distress.  HENT:  Head: Normocephalic and atraumatic.  Right Ear: External ear normal.  Left Ear: External ear normal.  Nose: Nose normal.  Mouth/Throat: Oropharynx is clear and moist. No oropharyngeal exudate.  Eyes: Conjunctivae and EOM are normal. Pupils are equal, round, and reactive to light. No scleral icterus.  Neck: Normal range of motion. Neck supple. No thyromegaly present.  Cardiovascular: Normal rate, regular rhythm, normal heart sounds and intact distal pulses.   No murmur heard. Pulses:      Radial pulses are 2+ on the right side, and 2+ on the left side.  Pulmonary/Chest: Effort normal and breath sounds normal. No respiratory distress. She has no wheezes. She has no rales.  Abdominal: Soft. Bowel sounds are normal. She exhibits no distension and no mass. There is no tenderness. There is no rebound and no guarding.  Musculoskeletal: Normal range of motion. She exhibits no edema.       FROM at neck, no midline or splenius mm tenderness. FROM at shoulders, no pain with testing SITS against resistance, neg empty can sign. No deformity or pain with palpation of shoulder Positive speed and mildly positive yergason. Pain with palpation of biceps tendon along groove.  Lymphadenopathy:    She has no cervical adenopathy.  Neurological: She is alert and oriented to person, place, and time.       CN grossly intact, station and gait intact  Skin: Skin is warm and dry. No rash noted.  Psychiatric: She has a normal mood and affect. Her behavior is normal. Judgment and thought content normal.      Assessment & Plan:

## 2011-09-27 NOTE — Patient Instructions (Addendum)
Good to see you today, keep appointment for mammogram later this month as well as with OBGYN next month. I thin you have bicipital tendonitis - stretching exercises provided. Use aleve regularly for next 3-5 days (twice daily with food), alternate ice/heat to area. Update me if not improving as expected. Call us with questions. Sign release form for records of colonoscopy Elwin Sleight), prior PCP Vear Clock), and dexa scan Lucianne Muss [place])

## 2011-09-27 NOTE — Assessment & Plan Note (Addendum)
I have personally reviewed the Medicare Annual Wellness questionnaire and have noted 1. The patient's medical and social history 2. Their use of alcohol, tobacco or illicit drugs 3. Their current medications and supplements 4. The patient's functional ability including ADL's, fall risks, home safety risks and hearing or visual impairment. 5. Diet and physical activity 6. Evidence for depression or mood disorders The patients weight, height, BMI have been recorded in the chart.  Hearing and vision has been addressed. I have made referrals, counseling and provided education to the patient based review of the above and I have provided the pt with a written personalized care plan for preventive services. See scanned questionairre.  Reviewed preventative protocols and updated unless pt declined. Well woman with OBGYN. Scheduled for mamogram. Gets vision yearly and hearing checked q60mo by audiology (hearing aides).

## 2011-09-27 NOTE — Assessment & Plan Note (Signed)
Reviewed blood work in detail

## 2011-09-27 NOTE — Assessment & Plan Note (Addendum)
Exam consistent with this - discussed treatment, see pt instructions. Provided with stretching exercises from SM pt advisor on bicipital tendonitis as well as resistance band. Update if not improved, consider referral to ortho/SM for injection.

## 2011-09-27 NOTE — Assessment & Plan Note (Signed)
Chronic. Stable on enalapril.

## 2011-09-27 NOTE — Assessment & Plan Note (Signed)
Relapsed, encouraged cessation.  Pt motivated to restart e cig.

## 2011-09-27 NOTE — Assessment & Plan Note (Signed)
A1c 5.5%, will change to h/o DM s/p gastric bypass.

## 2011-10-11 ENCOUNTER — Encounter: Payer: Self-pay | Admitting: Family Medicine

## 2011-10-12 ENCOUNTER — Encounter: Payer: Self-pay | Admitting: *Deleted

## 2011-10-13 DIAGNOSIS — M069 Rheumatoid arthritis, unspecified: Secondary | ICD-10-CM

## 2011-10-13 HISTORY — DX: Rheumatoid arthritis, unspecified: M06.9

## 2011-11-23 ENCOUNTER — Encounter: Payer: Self-pay | Admitting: Family Medicine

## 2011-11-23 ENCOUNTER — Ambulatory Visit (INDEPENDENT_AMBULATORY_CARE_PROVIDER_SITE_OTHER): Payer: MEDICARE | Admitting: Family Medicine

## 2011-11-23 VITALS — BP 126/60 | HR 72 | Temp 98.5°F | Wt 129.2 lb

## 2011-11-23 DIAGNOSIS — M2550 Pain in unspecified joint: Secondary | ICD-10-CM

## 2011-11-23 MED ORDER — TRAMADOL HCL 50 MG PO TABS: 50.0000 mg | ORAL_TABLET | Freq: Two times a day (BID) | ORAL | Status: AC | PRN

## 2011-11-23 MED ORDER — GLUCOSAMINE-CHONDROITIN 500-250 MG PO CAPS: 1.0000 | ORAL_CAPSULE | Freq: Two times a day (BID) | ORAL | Status: DC

## 2011-11-23 NOTE — Patient Instructions (Addendum)
I think this is arthritis. Start glucosamine chondroitin twice daily for the next month. Take tramadol for pain as needed, start with 1/2 dose. Return in 1 month for follow up, sooner if worsening.

## 2011-11-23 NOTE — Progress Notes (Signed)
  Subjective:    Patient ID: [name] [name], female    DOB: 1946/04/28, 66 y.o.   MRN: [id number]  HPI CC: muscle/joint pains  2 mo h/o joint and muscle pains.  Saturday morning worse.  R shoulder going down arm, starts in neck.  Also with PIP pain (mainly R 2nd), bilateral wrist pain, mild hip pain, bilateral knee and ankle pain as well as anterior leg pain, and pain under toes.  Denies redness or warmth to joints.  Endorsing some finger swelling as well.  Denies leg swelling.  Pain can get excruciating, described as "hurts" and burning.  Left shoulder doesn't really bother her.  No back pain.  No fevers/chills, abd pain, n/v, rashes, HA.  Denies numbness or weakness.  Appetite ok.  Prior thought right bicipital tendonitis,  Recommended otc aleve and stretching exerises.  Aleve works for a few hours.  Has tried moist heat.  Denies trauma/injury or exhertion to start this.  Wonders about fibromyalgia.  Has never had joint pains like this before.   Wt Readings from Last 3 Encounters:  11/23/11 129 lb 4 oz (58.627 kg)  09/27/11 132 lb 8 oz (60.102 kg)  06/28/11 134 lb 8 oz (61.009 kg)   Review of Systems Per HPI    Objective:   Physical Exam  Nursing note and vitals reviewed. Constitutional: She appears well-developed and well-nourished. No distress.  HENT:  Head: Normocephalic and atraumatic.  Mouth/Throat: Oropharynx is clear and moist. No oropharyngeal exudate.  Musculoskeletal: She exhibits no edema.       Mild tenderness to palpation 2nd/3rd right PIP joints as well as dorsal wrist on right. No active synovitis, no warmth, erythema, swelling appreciated. No pain with palpation or movement of MCPs, DIPs, shoulders, hips. Tender to palpation anterior knees and shins of leg. Tender to palpation lateral ankle ligaments. No significant enthesitis  Skin: Skin is warm and dry. No rash noted.  Psychiatric: She has a normal mood and affect.       Assessment & Plan:

## 2011-11-24 DIAGNOSIS — M069 Rheumatoid arthritis, unspecified: Secondary | ICD-10-CM | POA: Insufficient documentation

## 2011-11-24 NOTE — Assessment & Plan Note (Addendum)
Unclear etiology.  Not on any meds that should really cause these sxs. Not consistent with gout or rheumatoid arthritis. ?OA vs spondyloarthritides.  No evidence of infection. NSAID not helping sxs. Treat for now with tramadol, start glucosamine for possible OA. rtc 1 mo, sooner if worsening.  If not improved, consider blood work to eval for above (ESR, CRP, HLA B27) and imaging (hand xrays)

## 2011-12-03 ENCOUNTER — Other Ambulatory Visit (INDEPENDENT_AMBULATORY_CARE_PROVIDER_SITE_OTHER): Payer: MEDICARE

## 2011-12-03 ENCOUNTER — Telehealth: Payer: Self-pay | Admitting: Family Medicine

## 2011-12-03 ENCOUNTER — Telehealth: Payer: Self-pay | Admitting: *Deleted

## 2011-12-03 ENCOUNTER — Ambulatory Visit
Admission: RE | Admit: 2011-12-03 | Discharge: 2011-12-03 | Disposition: A | Discharge status: Home / Self Care | Payer: MEDICARE | Source: Ambulatory Visit | Attending: Family Medicine | Admitting: Family Medicine

## 2011-12-03 ENCOUNTER — Ambulatory Visit (INDEPENDENT_AMBULATORY_CARE_PROVIDER_SITE_OTHER)
Admission: RE | Admit: 2011-12-03 | Discharge: 2011-12-03 | Disposition: A | Discharge status: Home / Self Care | Payer: MEDICARE | Source: Ambulatory Visit | Attending: Family Medicine | Admitting: Family Medicine

## 2011-12-03 DIAGNOSIS — M2550 Pain in unspecified joint: Secondary | ICD-10-CM

## 2011-12-03 LAB — SEDIMENTATION RATE: Sed Rate: 27 mm/hr — ABNORMAL HIGH (ref 0–22)

## 2011-12-03 LAB — HIGH SENSITIVITY CRP: CRP, High Sensitivity: 26.98 mg/dL — ABNORMAL HIGH (ref 0.000–5.000)

## 2011-12-03 NOTE — Telephone Encounter (Signed)
Please order labs and xrays. Thanks! 

## 2011-12-03 NOTE — Telephone Encounter (Signed)
ordered

## 2011-12-03 NOTE — Telephone Encounter (Signed)
Patient said she has noticed very little to no change in pain. She is available to come in today for labs and xray. Scheduled for same.

## 2011-12-03 NOTE — Telephone Encounter (Signed)
According to husband (in office today), pills not helping pain.  Requests next step. [name] [name], can we call and touch base with Freddie re: ensure taking both glucosamine (glucosamine will take months to take effect) and tramadol (should have helped quickly). If not better, I'd like her to come in for blood work and Veterinary surgeon - will place order in chart later today.

## 2011-12-03 NOTE — Telephone Encounter (Signed)
Please order labs and xrays. Thanks!

## 2011-12-08 ENCOUNTER — Telehealth: Payer: Self-pay | Admitting: Family Medicine

## 2011-12-08 ENCOUNTER — Other Ambulatory Visit: Payer: Self-pay | Admitting: Family Medicine

## 2011-12-08 MED ORDER — MELOXICAM 7.5 MG PO TABS: 7.5000 mg | ORAL_TABLET | Freq: Every day | ORAL | Status: DC

## 2011-12-08 NOTE — Telephone Encounter (Signed)
Caller: John/Spouse; PCP: Eustaquio Boyden; CB#: [phone]; ; ; Call regarding Pain Medication Is Not Working; Would Like Appt; states pain is not being managed well with her current medication regime.  Declines new triage; states "Dr. Reece Agar. knows all about it, and has had tests done etc."  Appt sched per patient request  12/10/11 0815 with Dr. Sharen Hones.

## 2011-12-08 NOTE — Telephone Encounter (Signed)
Will see tomorrow.  Can we touch base with her beforehand to see if she wants to take anti inflammatory prescribed prior to appt?

## 2011-12-08 NOTE — Telephone Encounter (Signed)
Left message on patient voicemail to call about Rx prior to appointment,

## 2011-12-09 LAB — HLA-B27 ANTIGEN: DNA Result:: NOT DETECTED

## 2011-12-10 ENCOUNTER — Ambulatory Visit (INDEPENDENT_AMBULATORY_CARE_PROVIDER_SITE_OTHER): Payer: MEDICARE | Admitting: Family Medicine

## 2011-12-10 ENCOUNTER — Encounter: Payer: Self-pay | Admitting: Family Medicine

## 2011-12-10 VITALS — BP 149/71 | HR 90 | Temp 97.9°F | Wt 120.5 lb

## 2011-12-10 DIAGNOSIS — M2550 Pain in unspecified joint: Secondary | ICD-10-CM

## 2011-12-10 MED ORDER — PREDNISONE 20 MG PO TABS: ORAL_TABLET | ORAL | Status: DC

## 2011-12-10 NOTE — Progress Notes (Signed)
  Subjective:    Patient ID: [name] [name], female    DOB: 07/12/1945, 66 y.o.   MRN: [id number]  HPI CC: continued polyarthritis  See prior notes for details.  sxs ongoing since 09/2011.  Continued pain in ankles, knees, wrists and hands and R shoulder.  Pain always present in ankles and right wrist and left and right fingers and right shoulder.  Pain alternates right and left knees.  No hip or left shoulder or back or neck or elbow pain.  Pain present throughout the day, no significant morning stiffness.  No falls or trauma that she recalls.  No fevers/chills, skin rashes, abd pain, nausea, ST.  No dysuria, no vag discharge.  No recent travel outside of [place] [facility]).  No HA.  No similar sxs prior to April of this year.  Has tried 2 NSAIDs (mobic, naprosyn) with minimal relief.  Tramadol does help some, however causes sedation.  Has taken steroids and tolerated ok in past.  Had cold sxs 2 wks ago but resolving.  Recent return from beach.  Review of Systems Per HPI    Objective:   Physical Exam  Nursing note and vitals reviewed. Constitutional: She appears well-developed and well-nourished.  Musculoskeletal:       Swollen digits 2nd/3rd bilaterally, tender and swollen 2nd and 3rd PIP bilaterally, R 3rd DIP tender to palpation, swollen and tender wrists bilaterally.  PIPs somewhat erythematous as well. No significant MCP tenderness. Tender knees to palpation but FROM, no obvious swelling Tender ankles to palpation but FROM, no obvious swelling.  Psychiatric:       Frustrated, in tears from pain      Assessment & Plan:

## 2011-12-10 NOTE — Patient Instructions (Addendum)
Continue tramadol as needed for pain (50mg  at a time, may take up to 3 times daily) Stop meloxicam and other anti inflammatories while on steroids. Take prednisone taper as prescribed. Update me with how you're doing. Return in 2 weeks to recheck blood work (inflammatory markers) as well  Blood work today.

## 2011-12-14 ENCOUNTER — Other Ambulatory Visit: Payer: Self-pay | Admitting: Family Medicine

## 2011-12-14 DIAGNOSIS — M130 Polyarthritis, unspecified: Secondary | ICD-10-CM

## 2011-12-14 NOTE — Assessment & Plan Note (Signed)
Active synovitis with dactylitis present today. Somewhat advanced age for autoimmune disease presentation? xrays of hands last visit negative for crystalline or inflammatory arthritis, but evident arthritis of PIPs present. Unclear etiology of sxs. ESR WNL at 27, CRP elevated to 26.9 last visit but in middle of viral URTI, have asked her to return next week for recheck. Check RF, ANA and recheck uric acid (last check 09/2011 at onset of sxs was 5.3) Has not responded to NSAIDs, tramadol somewhat sedating. Treat with prednisone course - may end up referring to Rheum for further evaluation.

## 2011-12-20 ENCOUNTER — Telehealth: Payer: Self-pay | Admitting: *Deleted

## 2011-12-20 NOTE — Telephone Encounter (Signed)
Patient was calling because she hasn't heard about her rheumatology referral. Can either of you advise? I told her with last week being a holiday, that the process may have been delayed, but she asked that I check. She wanted the office called to verify they received her info because she is afraid the pain will return before she gets in to see the doctor.

## 2011-12-20 NOTE — Telephone Encounter (Signed)
Called patient, she was not aware of her Rheumatology appt which has been scheduled for Monday,July 29th at 8:30am with Azucena Fallen P.A. For Dr Dierdre Forth. They have patients medical records from Dr Sharen Hones.

## 2011-12-24 ENCOUNTER — Encounter: Payer: Self-pay | Admitting: Family Medicine

## 2011-12-24 ENCOUNTER — Ambulatory Visit (INDEPENDENT_AMBULATORY_CARE_PROVIDER_SITE_OTHER): Payer: MEDICARE | Admitting: Family Medicine

## 2011-12-24 VITALS — BP 140/80 | HR 80 | Temp 98.0°F | Wt 126.0 lb

## 2011-12-24 DIAGNOSIS — M130 Polyarthritis, unspecified: Secondary | ICD-10-CM

## 2011-12-24 MED ORDER — MELOXICAM 15 MG PO TABS: 15.0000 mg | ORAL_TABLET | Freq: Every day | ORAL | Status: DC

## 2011-12-24 NOTE — Patient Instructions (Signed)
Ive sent in meloxicam 15mg  daily. May use tramadol as needed. We will see what rheumatologist thinks. Good to see you today, call us with questions.

## 2011-12-24 NOTE — Progress Notes (Signed)
  Subjective:    Patient ID: [name] [name], female    DOB: July 13, 1945, 66 y.o.   MRN: [id number]  HPI CC: f/u joint pains  See prior notes for details. sxs ongoing since 09/2011.  Diffuse bilateral arthralgias, synovitis and dactylitis.    Blood work revealing positive RF at 69, negative ANA, and uric acid at 6.0.  ESR 27, CRP 26.9 (while had acute URTI).    Failed treatment with several NSAIDs, tramadol. Prednsione caused significant pain and swelling relief.  Finished prednisone taper 1 week ago, feeling pain returning.  On meloxicam 7.5mg  daily.  Quit smoking 11/2011  Review of Systems Per HPI    Objective:   Physical Exam NAD, WDWN Decreased erythema and swelling of 1st/2nd PIP and MCP joints bilaterally, decreased swelling of digits bilaterally.   Improved ROM bilaterally although still limited with hand grip R>L.    Assessment & Plan:

## 2011-12-24 NOTE — Assessment & Plan Note (Signed)
Bilateral symmetrical polyarthritis, elevated RF.  Suspicion for RA although somewhat abnormal given advanced age of presentation. referral to rheum pending.   Will increase meloxicam to 15mg  daily, use tramadol prn. If worsening to update me for consideration of another steroid course prior to rheum appt.

## 2011-12-27 ENCOUNTER — Ambulatory Visit: Payer: MEDICARE | Admitting: Family Medicine

## 2012-01-14 ENCOUNTER — Encounter: Payer: Self-pay | Admitting: Family Medicine

## 2012-02-24 HISTORY — PX: FACIAL COSMETIC SURGERY: SHX629

## 2012-03-06 ENCOUNTER — Telehealth (INDEPENDENT_AMBULATORY_CARE_PROVIDER_SITE_OTHER): Payer: Self-pay | Admitting: Surgery

## 2012-03-06 NOTE — Telephone Encounter (Signed)
I spoke with the patient via phone and we scheduled her annual gastric bypass surgery follow-up appointment for 04/21/12 @ 3:10 pm with Dr Daphine Deutscher...cef

## 2012-03-10 ENCOUNTER — Other Ambulatory Visit: Payer: Self-pay | Admitting: *Deleted

## 2012-03-10 MED ORDER — ROPINIROLE HCL 1 MG PO TABS: 1.0000 mg | ORAL_TABLET | Freq: Every day | ORAL | Status: DC

## 2012-03-24 ENCOUNTER — Ambulatory Visit (INDEPENDENT_AMBULATORY_CARE_PROVIDER_SITE_OTHER): Payer: MEDICARE | Admitting: Family Medicine

## 2012-03-24 ENCOUNTER — Telehealth: Payer: Self-pay | Admitting: Family Medicine

## 2012-03-24 ENCOUNTER — Encounter: Payer: Self-pay | Admitting: Family Medicine

## 2012-03-24 VITALS — BP 150/100 | HR 80 | Temp 98.7°F | Wt 124.0 lb

## 2012-03-24 DIAGNOSIS — R259 Unspecified abnormal involuntary movements: Secondary | ICD-10-CM

## 2012-03-24 DIAGNOSIS — Z23 Encounter for immunization: Secondary | ICD-10-CM

## 2012-03-24 DIAGNOSIS — R252 Cramp and spasm: Secondary | ICD-10-CM

## 2012-03-24 NOTE — Progress Notes (Signed)
  Subjective:    Patient ID: [name] [name], female    DOB: 10-13-1945, 66 y.o.   MRN: [id number]  HPI CC: mouth stiffness  [name] [name] is a pleasant 66 yo with recent diagnosis of RA for which she was recently started on remicade infusions this year.  Woke up this morning with inability to fully open mouth 2/2 pain at right cheek/jaw.  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  Did improve some with infusion today.  Chewing hurts.    No fevers/chills, rashes, dysphagia, ST, nausea.  No HA.  No change in saliva production on right.  02/24/2012 did have facelift done by Dr. Izora Ribas.  She states she has had tetanus shot recently.  bp elevated today.  More stressed today. BP Readings from Last 3 Encounters:  03/24/12 150/100  12/24/11 140/80  12/10/11 149/71   Past Medical History  Diagnosis Date  . Diabetes mellitus 2005    diet controlled sinec bypass, prior on meds/insulin  . Arthritis   . Depression     off meds  . HTN (hypertension)   . Gastric bypass status for obesity   . Smoking     quit 05/2011, relapsed, using e cig  . Hearing impaired     hearing aides bilaterally  . Rheumatoid arthritis 10/2011    Dierdre Forth)    Review of Systems Per HPI    Objective:   Physical Exam  Nursing note and vitals reviewed. Constitutional: She appears well-developed and well-nourished. No distress.       nontoxic  HENT:  Head: Normocephalic and atraumatic. There is trismus in the jaw.  Right Ear: Hearing, tympanic membrane, external ear and ear canal normal.  Left Ear: Hearing, tympanic membrane, external ear and ear canal normal.  Nose: No mucosal edema or rhinorrhea.  Mouth/Throat: Uvula is midline, oropharynx is clear and moist and mucous membranes are normal. She has dentures (upper and permanent lower). No oral lesions. No dental abscesses. No oropharyngeal exudate, posterior oropharyngeal edema, posterior oropharyngeal erythema or tonsillar abscesses.       Tender to palpation at R TMJ and inferior to  it.  Slight swelling but no erythema or warmth. No pain at angle of mandible. No blockage or drainage from stetson's duct. Removed upper dentures, no evident inflammation noted.  Eyes: Conjunctivae normal and EOM are normal. Pupils are equal, round, and reactive to light. No scleral icterus.  Neck: Normal range of motion. Neck supple.       FROM at neck  Lymphadenopathy:    She has no cervical adenopathy.  Skin: No rash noted.       Assessment & Plan:

## 2012-03-24 NOTE — Telephone Encounter (Signed)
Caller: [name]/Patient; Patient Name: [name], [name]; PCP: Eustaquio Boyden [facility]); Best Callback Phone Number: [phone]; Call regarding Unable to open Mouth, onset 10-11.  Pt had infusion on 10-10 for RA. Rheumatologist advised Pt to follow up with PCP due to symptoms didn't clear up after infusion.  Jaw symptoms protocol used, ED disposition due to unable to open mouth fully.  Appt scheduled at 1400 on 10-11 with Dr Sharen Hones, Pt unable to come any sooner.

## 2012-03-24 NOTE — Telephone Encounter (Signed)
Will see today at 2pm. I believe she is on remicade

## 2012-03-24 NOTE — Addendum Note (Signed)
Addended by: Josph Macho A on: 03/24/2012 02:56 PM   Modules accepted: Orders

## 2012-03-24 NOTE — Assessment & Plan Note (Addendum)
Broad differential, but anticipate either rheumatoid involvement of R TMJ or actually TMJ dysfunction. Doubt infection 2/2 no fever, warmth, redness at site. Doubt complication of facelift as >11mo ago. Doubt parotitis. If not improving as expected, would consider imaging - to call me next week with update. rec f/u with dentist for discussion of TMJ dysfunction Treat with small dose of NSAIDs. Updated tetanus shot (Td).

## 2012-03-24 NOTE — Patient Instructions (Addendum)
I think this is either TMJ dysfunction or rheumatoid arthritis of TMJ (temporomandibular joint). Treat with ibuprofen over the counter as well as do stretching exercises discussed today. If any fevers, worsening pain or spreading redness/warmth at that joint, please seek urgent care.

## 2012-03-31 ENCOUNTER — Other Ambulatory Visit: Payer: Self-pay | Admitting: *Deleted

## 2012-03-31 MED ORDER — GABAPENTIN 300 MG PO CAPS: 300.0000 mg | ORAL_CAPSULE | Freq: Two times a day (BID) | ORAL | Status: DC

## 2012-03-31 MED ORDER — TRAMADOL HCL 50 MG PO TABS: 25.0000 mg | ORAL_TABLET | Freq: Two times a day (BID) | ORAL | Status: DC | PRN

## 2012-03-31 NOTE — Telephone Encounter (Signed)
Spoke with patient. She said she is only taking it BID. Sent into pharmacy reflecting change.

## 2012-03-31 NOTE — Telephone Encounter (Signed)
Ok to refill 

## 2012-03-31 NOTE — Telephone Encounter (Addendum)
Received faxed refill request for Gabapentin 300 mg 1 cap TID, and one for Tramadol 50 mg bid prn. Both requested rx's are different that what we have on file for pt. I called pt and left message on her cell v/m to return call to office.

## 2012-04-21 ENCOUNTER — Encounter (INDEPENDENT_AMBULATORY_CARE_PROVIDER_SITE_OTHER): Payer: Self-pay | Admitting: Surgery

## 2012-04-21 ENCOUNTER — Ambulatory Visit (INDEPENDENT_AMBULATORY_CARE_PROVIDER_SITE_OTHER): Payer: MEDICARE | Admitting: Surgery

## 2012-04-21 VITALS — BP 110/64 | HR 68 | Temp 97.1°F | Resp 14 | Ht 61.5 in | Wt 123.2 lb

## 2012-04-21 DIAGNOSIS — Z9884 Bariatric surgery status: Secondary | ICD-10-CM

## 2012-04-21 NOTE — Patient Instructions (Signed)
Thanks for your patience.  If you need further assistance after leaving the office, please call our office and speak with a [facility] nurse.  [phone].  If you want to leave a message for Dr. Antara Brecheisen, please call his office phone at [phone]. 

## 2012-04-21 NOTE — Progress Notes (Signed)
[name] [name] 66 y.o.  Body mass index is 22.90 kg/(m^2).  Patient Active Problem List  Diagnosis  . History of diabetes mellitus, type II  . HYPERTENSION  . NEPHRECTOMY, HX OF  . Depression  . Smoker  . Gout  . Medicare annual wellness visit, initial  . Biceps tendonitis on left  . Polyarthritis  . Trismus  . 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 Morphine And Related Other (See Comments)    Hallucinations    Past Surgical History  Procedure Date  . Eye surgery     2 as a child  . Nephrectomy 1977    right kidney removed, traumatic  . Abdominal hysterectomy 1973    partial, dysmenorrhea, ovaries still remain  . Gastric bypass 2009    roux en y Daphine Deutscher)  . Facial cosmetic surgery 02/24/12    Dr. Braulio Bosch, MD 1. 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 I have not seen her since March 2012. Since then she is developed rheumatoid arthritis and is currently receiving IV infusions of Remicade. She is overall very happy at her success since she has lost about 83 pounds and a side for some occasional nausea when she overeats she really has been symptom free except for some hair loss Her labs are being followed closely by her arthritis physician, Dr. Clarita Crane. I don't know that need to do anything at the present time but would like to see her back in one year. Matt B. Daphine Deutscher, MD, [facility] [phone] beeper [phone]  04/21/2012 4:19 PM

## 2012-05-02 ENCOUNTER — Encounter: Payer: Self-pay | Admitting: Gastroenterology

## 2012-06-02 ENCOUNTER — Other Ambulatory Visit: Payer: Self-pay | Admitting: *Deleted

## 2012-06-02 MED ORDER — TRAMADOL HCL 50 MG PO TABS: 25.0000 mg | ORAL_TABLET | Freq: Two times a day (BID) | ORAL | Status: DC | PRN

## 2012-06-02 NOTE — Telephone Encounter (Signed)
Ok to refill 

## 2012-06-12 ENCOUNTER — Encounter: Payer: Self-pay | Admitting: Gastroenterology

## 2012-06-14 DIAGNOSIS — K5790 Diverticulosis of intestine, part unspecified, without perforation or abscess without bleeding: Secondary | ICD-10-CM

## 2012-06-14 DIAGNOSIS — J449 Chronic obstructive pulmonary disease, unspecified: Secondary | ICD-10-CM

## 2012-06-14 HISTORY — DX: Chronic obstructive pulmonary disease, unspecified: J44.9

## 2012-06-14 HISTORY — DX: Diverticulosis of intestine, part unspecified, without perforation or abscess without bleeding: K57.90

## 2012-07-11 ENCOUNTER — Encounter: Payer: Self-pay | Admitting: Gastroenterology

## 2012-07-11 ENCOUNTER — Ambulatory Visit (AMBULATORY_SURGERY_CENTER): Payer: MEDICARE | Admitting: *Deleted

## 2012-07-11 VITALS — Ht 63.0 in | Wt 118.0 lb

## 2012-07-11 DIAGNOSIS — Z1211 Encounter for screening for malignant neoplasm of colon: Secondary | ICD-10-CM

## 2012-07-11 MED ORDER — NA SULFATE-K SULFATE-MG SULF 17.5-3.13-1.6 GM/177ML PO SOLN: ORAL | Status: DC

## 2012-07-11 NOTE — Progress Notes (Signed)
No egg or soy allergy 

## 2012-07-15 HISTORY — PX: COLONOSCOPY: SHX174

## 2012-07-21 ENCOUNTER — Other Ambulatory Visit: Payer: Self-pay | Admitting: *Deleted

## 2012-07-21 MED ORDER — TRAMADOL HCL 50 MG PO TABS: 25.0000 mg | ORAL_TABLET | Freq: Two times a day (BID) | ORAL | Status: DC | PRN

## 2012-07-21 NOTE — Telephone Encounter (Signed)
Last refilled 06/02/12

## 2012-07-25 ENCOUNTER — Encounter: Payer: Self-pay | Admitting: Gastroenterology

## 2012-07-25 ENCOUNTER — Ambulatory Visit (AMBULATORY_SURGERY_CENTER): Payer: MEDICARE | Admitting: Gastroenterology

## 2012-07-25 VITALS — BP 148/71 | HR 62 | Temp 98.2°F | Resp 20 | Ht 63.0 in | Wt 118.0 lb

## 2012-07-25 DIAGNOSIS — Z1211 Encounter for screening for malignant neoplasm of colon: Secondary | ICD-10-CM

## 2012-07-25 DIAGNOSIS — K5730 Diverticulosis of large intestine without perforation or abscess without bleeding: Secondary | ICD-10-CM

## 2012-07-25 MED ORDER — SODIUM CHLORIDE 0.9 % IV SOLN: 500.0000 mL | INTRAVENOUS | Status: DC

## 2012-07-25 NOTE — Progress Notes (Signed)
Patient did not experience any of the following events: a burn prior to discharge; a fall within the facility; wrong site/side/patient/procedure/implant event; or a hospital transfer or hospital admission upon discharge from the facility. (G8907) Patient did not have preoperative order for IV antibiotic SSI prophylaxis. (G8918)  

## 2012-07-25 NOTE — Patient Instructions (Addendum)
Discharge instructions given with verbal understanding. Handout on diverticulosis and a high fiber diet given. Resume previous medications. YOU HAD AN ENDOSCOPIC PROCEDURE TODAY AT THE [facility]: Refer to the procedure report that was given to you for any specific questions about what was found during the examination.  If the procedure report does not answer your questions, please call your gastroenterologist to clarify.  If you requested that your care partner not be given the details of your procedure findings, then the procedure report has been included in a sealed envelope for you to review at your convenience later.  YOU SHOULD EXPECT: Some feelings of bloating in the abdomen. Passage of more gas than usual.  Walking can help get rid of the air that was put into your GI tract during the procedure and reduce the bloating. If you had a lower endoscopy (such as a colonoscopy or flexible sigmoidoscopy) you may notice spotting of blood in your stool or on the toilet paper. If you underwent a bowel prep for your procedure, then you may not have a normal bowel movement for a few days.  DIET: Your first meal following the procedure should be a light meal and then it is ok to progress to your normal diet.  A half-sandwich or bowl of soup is an example of a good first meal.  Heavy or fried foods are harder to digest and may make you feel nauseous or bloated.  Likewise meals heavy in dairy and vegetables can cause extra gas to form and this can also increase the bloating.  Drink plenty of fluids but you should avoid alcoholic beverages for 24 hours.  ACTIVITY: Your care partner should take you home directly after the procedure.  You should plan to take it easy, moving slowly for the rest of the day.  You can resume normal activity the day after the procedure however you should NOT DRIVE or use heavy machinery for 24 hours (because of the sedation medicines used during the test).    SYMPTOMS TO  REPORT IMMEDIATELY: A gastroenterologist can be reached at any hour.  During normal business hours, 8:30 AM to 5:00 PM Monday through Friday, call [phone].  After hours and on weekends, please call the GI answering service at [phone] who will take a message and have the physician on call contact you.   Following lower endoscopy (colonoscopy or flexible sigmoidoscopy):  Excessive amounts of blood in the stool  Significant tenderness or worsening of abdominal pains  Swelling of the abdomen that is new, acute  Fever of 100F or higher  FOLLOW UP: If any biopsies were taken you will be contacted by phone or by letter within the next 1-3 weeks.  Call your gastroenterologist if you have not heard about the biopsies in 3 weeks.  Our staff will call the home number listed on your records the next business day following your procedure to check on you and address any questions or concerns that you may have at that time regarding the information given to you following your procedure. This is a courtesy call and so if there is no answer at the home number and we have not heard from you through the emergency physician on call, we will assume that you have returned to your regular daily activities without incident.  SIGNATURES/CONFIDENTIALITY: You and/or your care partner have signed paperwork which will be entered into your electronic medical record.  These signatures attest to the fact that that the information above on  your After Visit Summary has been reviewed and is understood.  Full responsibility of the confidentiality of this discharge information lies with you and/or your care-partner.

## 2012-07-25 NOTE — Op Note (Signed)
[facility] [address] [place], [address]   COLONOSCOPY PROCEDURE REPORT  PATIENT: [name] [name], [name] [name]  MR#: [id number] BIRTHDATE: [birth year] , 66  yrs. old GENDER: Female ENDOSCOPIST: Louis Meckel, MD REFERRED XB:JYNWGN Gutierrez, M.D. PROCEDURE DATE:  07/25/2012 PROCEDURE:   Colonoscopy, diagnostic ASA CLASS:   Class II INDICATIONS: MEDICATIONS: MAC sedation, administered by CRNA and propofol (Diprivan) 200mg  IV  DESCRIPTION OF PROCEDURE:   After the risks benefits and alternatives of the procedure were thoroughly explained, informed consent was obtained.  A digital rectal exam revealed no abnormalities of the rectum.   The LB [id number]-[id number]  endoscope was introduced through the anus and advanced to the cecum, which was identified by both the appendix and ileocecal valve. No adverse events experienced.   The quality of the prep was Suprep excellent The instrument was then slowly withdrawn as the colon was fully examined.      COLON FINDINGS: Moderate diverticulosis was noted in the sigmoid colon, descending colon, and transverse colon.   The colon mucosa was otherwise normal.  Retroflexed views revealed no abnormalities. The time to cecum=3 minutes 36 seconds.  Withdrawal time=6 minutes 0 seconds.  The scope was withdrawn and the procedure completed. COMPLICATIONS: There were no complications.  ENDOSCOPIC IMPRESSION: 1.   Moderate diverticulosis was noted in the sigmoid colon, descending colon, and transverse colon 2.   The colon mucosa was otherwise normal  RECOMMENDATIONS: Continue current colorectal screening recommendations for "routine risk" patients with a repeat colonoscopy in 10 years.   eSigned:  Louis Meckel, MD 07/25/2012 11:21 AM   cc:

## 2012-07-26 ENCOUNTER — Telehealth: Payer: Self-pay | Admitting: *Deleted

## 2012-07-26 NOTE — Telephone Encounter (Signed)
  Follow up Call-  Call back number 07/25/2012  Post procedure Call Back phone  # [phone]  Permission to leave phone message Yes     Patient questions:  Do you have a fever, pain , or abdominal swelling? no Pain Score  0 *  Have you tolerated food without any problems? yes  Have you been able to return to your normal activities? yes  Do you have any questions about your discharge instructions: Diet   no Medications  no Follow up visit  no  Do you have questions or concerns about your Care? no  Actions: * If pain score is 4 or above: No action needed, pain <4.

## 2012-07-31 ENCOUNTER — Other Ambulatory Visit: Payer: Self-pay | Admitting: *Deleted

## 2012-07-31 ENCOUNTER — Encounter: Payer: Self-pay | Admitting: Family Medicine

## 2012-07-31 MED ORDER — GABAPENTIN 300 MG PO CAPS: 300.0000 mg | ORAL_CAPSULE | Freq: Two times a day (BID) | ORAL | Status: DC

## 2012-08-11 ENCOUNTER — Ambulatory Visit: Payer: MEDICARE | Admitting: Family Medicine

## 2012-08-15 ENCOUNTER — Ambulatory Visit: Payer: MEDICARE | Admitting: Family Medicine

## 2012-08-18 ENCOUNTER — Ambulatory Visit: Payer: MEDICARE | Admitting: Family Medicine

## 2012-08-22 ENCOUNTER — Ambulatory Visit (INDEPENDENT_AMBULATORY_CARE_PROVIDER_SITE_OTHER): Payer: MEDICARE | Admitting: Family Medicine

## 2012-08-22 ENCOUNTER — Encounter: Payer: Self-pay | Admitting: Family Medicine

## 2012-08-22 VITALS — BP 128/70 | HR 71 | Temp 98.6°F

## 2012-08-22 DIAGNOSIS — R1905 Periumbilic swelling, mass or lump: Secondary | ICD-10-CM | POA: Insufficient documentation

## 2012-08-22 DIAGNOSIS — M79605 Pain in left leg: Secondary | ICD-10-CM

## 2012-08-22 DIAGNOSIS — M79604 Pain in right leg: Secondary | ICD-10-CM

## 2012-08-22 DIAGNOSIS — M79609 Pain in unspecified limb: Secondary | ICD-10-CM

## 2012-08-22 MED ORDER — GABAPENTIN 300 MG PO CAPS: 300.0000 mg | ORAL_CAPSULE | Freq: Three times a day (TID) | ORAL | Status: DC

## 2012-08-22 NOTE — Patient Instructions (Addendum)
Try tonic water - 2 oz at bedtime - to see if any improvement Increase gabapentin to three times daily. If neither of these help, call me to set you up with nerve conduction study. For abdomen - pass by Marion's office for ultrasound.

## 2012-08-22 NOTE — Progress Notes (Signed)
Subjective:    Patient ID: [name] [name], female    DOB: Sep 01, 1945, 67 y.o.   MRN: [id number]  HPI CC: leg pain  Leg pain - ongoing for last 8-10 years.  Wakes her up at night.  Wakes up with legs hurting.  Also present during the day.  Happening most nights.  Leg pain described as ache anterior legs and thighs as well as left hip.  Denies tingling or numbness in legs.  No posterior leg pain or calf pain.  No claudication sxs.  No significant leg cramping.  L>R.  No recent prolonged immobility.  No h/o blood clots, or family history of blood clots.  H/o RA - told by Dr. Dierdre Forth leg pain not due to this.  Had xrays of lower legs and upper legs, told normal.  H/o RLS - takes requip nightly.  Averages 6 hours per night.  Also on gabapentin for this.  Lump on stomach - noticed this morning, hard lump.  Not tender.  No skin changes.  No nausea/vomiting.  Some mild constipation. Wt Readings from Last 3 Encounters:  07/25/12 118 lb (53.524 kg)  07/11/12 118 lb (53.524 kg)  04/21/12 123 lb 3.2 oz (55.883 kg)    Past Medical History  Diagnosis Date  . Diabetes mellitus 2005    diet controlled sinec bypass, prior on meds/insulin  . Arthritis   . HTN (hypertension)   . Gastric bypass status for obesity   . Smoking     quit 05/2011, relapsed, using e cig  . Hearing impaired     hearing aides bilaterally  . Rheumatoid arthritis 10/2011    Dierdre Forth)  . Cataract     begining stages  . Depression     off meds, pt denies  . GERD (gastroesophageal reflux disease)   . Neuromuscular disorder     small amt of neuropathy in both legs- takes Requip  . Diverticulosis 2014    by colonoscopy    Past Surgical History  Procedure Laterality Date  . Eye surgery      2 as a child  . Nephrectomy  1977    right kidney removed, traumatic  . Abdominal hysterectomy  1973    partial, dysmenorrhea, ovaries still remain  . Gastric bypass  2009    roux en y Daphine Deutscher)  . Facial cosmetic surgery  02/24/12     Dr. Izora Ribas  . Colonoscopy  07/2012    mod diverticulosis Arlyce Dice)    Review of Systems per HPI     Objective:   Physical Exam  Nursing note and vitals reviewed. Constitutional: She appears well-developed and well-nourished. No distress.  Abdominal: Soft. Normal appearance and bowel sounds are normal. She exhibits mass. She exhibits no distension. There is no hepatosplenomegaly. There is no tenderness. There is no rebound, no guarding and no CVA tenderness. No hernia.  Superficial firm rubbery mobile nodule left of umbilicus about 1cm size, nontender  Musculoskeletal: She exhibits no edema.  Small spider veins, no major varicosities No pain to palpation throughout. No palpable cords or noted leg/calf swelling  Diabetic foot exam: Normal inspection No skin breakdown No calluses  Normal DP/PT pulses - 2+ bilaterally Normal sensation to light touch but slightly diminished to monofilament and temperature on left Nails normal  Neurological:  5/5 strength BLE station, gait intact  Skin: Skin is warm and dry. No rash noted.  Psychiatric: She has a normal mood and affect.       Assessment & Plan:

## 2012-08-22 NOTE — Assessment & Plan Note (Signed)
New, of 1 d duration. Very superficial. Discussed possible causes including lymphadenopthy, scar tissue/nodule, omental scarring. Will monitor for now - check abd [place] to further eval swelling.

## 2012-08-22 NOTE — Assessment & Plan Note (Addendum)
Chronic.   L>R. Some evidence of neuropathy, minimal venous insufficiency changes but no swelling.  No risk factors for DVT. rec trial of tonic water at night, if doesn't help, rec increase gabapentin to tid dosing. If not better, discussed possible NCS.   Normal vitamin levels. Continue requip at 1mg  at bedtime (could increase dose). Pt agrees with plan.

## 2012-08-30 NOTE — Addendum Note (Signed)
Addended by: Eustaquio Boyden on: 08/30/2012 08:01 PM   Modules accepted: Orders

## 2012-09-01 ENCOUNTER — Telehealth: Payer: Self-pay

## 2012-09-01 ENCOUNTER — Ambulatory Visit: Payer: Self-pay | Admitting: Family Medicine

## 2012-09-01 DIAGNOSIS — R634 Abnormal weight loss: Secondary | ICD-10-CM

## 2012-09-01 DIAGNOSIS — R110 Nausea: Secondary | ICD-10-CM

## 2012-09-01 DIAGNOSIS — R1905 Periumbilic swelling, mass or lump: Secondary | ICD-10-CM

## 2012-09-01 NOTE — Telephone Encounter (Signed)
Pt had US done today at [facility]; when results are ready call pt.[phone].

## 2012-09-04 NOTE — Telephone Encounter (Signed)
plz notify I still don't have results from [facility] - plz try and obtain results of US done Friday.

## 2012-09-04 NOTE — Telephone Encounter (Signed)
Requested results

## 2012-09-04 NOTE — Telephone Encounter (Signed)
Spoke with patient who is endorsing increased nausea as well as recent unexpected weight loss over last several months Wt Readings from Last 3 Encounters:  07/25/12 118 lb (53.524 kg)  07/11/12 118 lb (53.524 kg)  04/21/12 123 lb 3.2 oz (55.883 kg)  non-conclusive soft tissue US - ?lymph node vs cyst. Given above concerning sxs, will schedule abd/pelvic CT tomorrow with contrast and set up with surgery referral. Lab Results  Component Value Date   CREATININE 0.8 09/20/2011  will route to [facility].

## 2012-09-04 NOTE — Telephone Encounter (Signed)
Pt request call back with [place] results.

## 2012-09-04 NOTE — Telephone Encounter (Signed)
Results in IN box.

## 2012-09-05 ENCOUNTER — Other Ambulatory Visit (INDEPENDENT_AMBULATORY_CARE_PROVIDER_SITE_OTHER): Payer: Medicare Other

## 2012-09-05 ENCOUNTER — Other Ambulatory Visit (INDEPENDENT_AMBULATORY_CARE_PROVIDER_SITE_OTHER): Payer: Self-pay

## 2012-09-05 ENCOUNTER — Ambulatory Visit (INDEPENDENT_AMBULATORY_CARE_PROVIDER_SITE_OTHER)
Admission: RE | Admit: 2012-09-05 | Discharge: 2012-09-05 | Disposition: A | Discharge status: Home / Self Care | Payer: Medicare Other | Source: Ambulatory Visit | Attending: Family Medicine | Admitting: Family Medicine

## 2012-09-05 ENCOUNTER — Encounter: Payer: Self-pay | Admitting: Family Medicine

## 2012-09-05 DIAGNOSIS — R634 Abnormal weight loss: Secondary | ICD-10-CM

## 2012-09-05 DIAGNOSIS — R1905 Periumbilic swelling, mass or lump: Secondary | ICD-10-CM

## 2012-09-05 DIAGNOSIS — R110 Nausea: Secondary | ICD-10-CM

## 2012-09-05 DIAGNOSIS — R109 Unspecified abdominal pain: Secondary | ICD-10-CM

## 2012-09-05 MED ORDER — IOHEXOL 300 MG/ML  SOLN
80.0000 mL | Freq: Once | INTRAMUSCULAR | Status: AC | PRN
  Administered 2012-09-05: 80 mL via INTRAVENOUS

## 2012-09-07 ENCOUNTER — Encounter (INDEPENDENT_AMBULATORY_CARE_PROVIDER_SITE_OTHER): Payer: Self-pay | Admitting: Surgery

## 2012-09-07 ENCOUNTER — Ambulatory Visit (INDEPENDENT_AMBULATORY_CARE_PROVIDER_SITE_OTHER): Payer: BC Managed Care – HMO | Admitting: Surgery

## 2012-09-07 VITALS — BP 120/76 | HR 60 | Temp 97.0°F | Resp 12 | Ht 62.0 in | Wt 114.0 lb

## 2012-09-07 DIAGNOSIS — R1905 Periumbilic swelling, mass or lump: Secondary | ICD-10-CM

## 2012-09-07 NOTE — Patient Instructions (Signed)
Thanks for your patience.  If you need further assistance after leaving the office, please call our office and speak with a [facility] nurse.  [phone].  If you want to leave a message for Dr. Dezman Granda, please call his office phone at [phone]. 

## 2012-09-07 NOTE — Progress Notes (Signed)
[name] [name] 67 y.o.  Body mass index is 20.85 kg/(m^2).  Patient Active Problem List  Diagnosis  . History of diabetes mellitus, type II  . HYPERTENSION  . NEPHRECTOMY, HX OF  . Depression  . Smoker  . Gout  . Medicare annual wellness visit, initial  . Biceps tendonitis on left  . Polyarthritis  . Trismus  . 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 Abdominal wall mass of periumbilical region  . Leg pain, bilateral    Allergies  Allergen Reactions  . Morphine And Related Other (See Comments)    Hallucinations    Past Surgical History  Procedure Laterality Date  . Eye surgery      2 as a child  . Nephrectomy  1977    right kidney removed, traumatic  . Abdominal hysterectomy  1973    partial, dysmenorrhea, ovaries still remain  . Gastric bypass  2009    roux en y Daphine Deutscher)  . Facial cosmetic surgery  02/24/12    Dr. Izora Ribas  . Colonoscopy  07/2012    mod diverticulosis Arlyce Dice)   Eustaquio Boyden, MD No diagnosis found.  [name] [name] Comes in today because of a new finding slowly to the left of her umbilicus. There is a grape-sized rubbery mass about 2 fingerbreadths left lateral to the umbilicus that is consistent by exam and by CT with a small fat filled ventral hernia. This could be any trocar site. It is asymptomatic at present and I would not do anything with it. 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 She has done well and her weight today is 114 indicating that she has lost 92 pounds. Her diabetes mellitus has resolved. She is taking few medications.  I think we will observe this little mass. She does have problems with heartburn for which she takes Prilosec. Will continue to follow and will schedule her for followup in this office in Nov which will be her 4 year anniversary.  Impression small asymptomatic fat filled ventral hernia. Very good weight loss after Y. Gastric bypass 3-1/2 years ago. Will see back in November which will  be 4 year anniversary. Matt B. Daphine Deutscher, MD, [facility] [phone] beeper [phone]  09/07/2012 9:21 AM

## 2012-10-26 ENCOUNTER — Ambulatory Visit (INDEPENDENT_AMBULATORY_CARE_PROVIDER_SITE_OTHER): Payer: Medicare Other | Admitting: Family Medicine

## 2012-10-26 ENCOUNTER — Encounter: Payer: Self-pay | Admitting: *Deleted

## 2012-10-26 ENCOUNTER — Encounter: Payer: Self-pay | Admitting: Family Medicine

## 2012-10-26 ENCOUNTER — Ambulatory Visit (INDEPENDENT_AMBULATORY_CARE_PROVIDER_SITE_OTHER)
Admission: RE | Admit: 2012-10-26 | Discharge: 2012-10-26 | Disposition: A | Discharge status: Home / Self Care | Payer: Medicare Other | Source: Ambulatory Visit | Attending: Family Medicine | Admitting: Family Medicine

## 2012-10-26 VITALS — BP 160/90 | HR 96 | Temp 99.0°F | Wt 107.5 lb

## 2012-10-26 DIAGNOSIS — R5381 Other malaise: Secondary | ICD-10-CM

## 2012-10-26 DIAGNOSIS — R634 Abnormal weight loss: Secondary | ICD-10-CM

## 2012-10-26 DIAGNOSIS — R531 Weakness: Secondary | ICD-10-CM

## 2012-10-26 LAB — CBC WITH DIFFERENTIAL/PLATELET
Basophils Relative: 0.2 % (ref 0.0–3.0)
Eosinophils Relative: 0.4 % (ref 0.0–5.0)
Lymphocytes Relative: 11.5 % — ABNORMAL LOW (ref 12.0–46.0)
MCV: 92.2 fl (ref 78.0–100.0)
Monocytes Absolute: 0.7 10*3/uL (ref 0.1–1.0)
Monocytes Relative: 9.6 % (ref 3.0–12.0)
Neutrophils Relative %: 78.3 % — ABNORMAL HIGH (ref 43.0–77.0)
Platelets: 150 10*3/uL (ref 150.0–400.0)
RBC: 4.66 Mil/uL (ref 3.87–5.11)
WBC: 7.3 10*3/uL (ref 4.5–10.5)

## 2012-10-26 LAB — COMPREHENSIVE METABOLIC PANEL
Albumin: 3.5 g/dL (ref 3.5–5.2)
Alkaline Phosphatase: 239 U/L — ABNORMAL HIGH (ref 39–117)
BUN: 22 mg/dL (ref 6–23)
CO2: 30 mEq/L (ref 19–32)
GFR: 67.93 mL/min (ref >60.00)
Glucose, Bld: 116 mg/dL — ABNORMAL HIGH (ref 70–99)
Potassium: 3.7 mEq/L (ref 3.5–5.1)

## 2012-10-26 LAB — TSH: TSH: 0.76 u[IU]/mL (ref 0.35–5.50)

## 2012-10-26 NOTE — Assessment & Plan Note (Signed)
Unsure etiology.  Today stable, nontoxic. Concern for kidney insuff given solitary kidney, on ACEI, and not good water intake. Check blood work today to eval this.
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
## 2012-10-26 NOTE — Patient Instructions (Signed)
Less caffeine, more water. Blood work today. Xray today. schedule mammogram.

## 2012-10-26 NOTE — Assessment & Plan Note (Addendum)
Concerning weight loss noted over last several months despite good appetite and endorsed regular meals (albeit fast food) Will start w/u with CXR in this longterm smoker. Check blood work as well. Encouraged increased water, decreased caffeine intake. Recent CT abd/pelvis reviewed. rtc 1 mo for f/u. Consider CT lungs pending CXR results.

## 2012-10-27 ENCOUNTER — Emergency Department (HOSPITAL_COMMUNITY): Payer: Medicare Other

## 2012-10-27 ENCOUNTER — Encounter: Payer: Self-pay | Admitting: Family Medicine

## 2012-10-27 ENCOUNTER — Telehealth: Payer: Self-pay

## 2012-10-27 ENCOUNTER — Inpatient Hospital Stay (HOSPITAL_COMMUNITY)
Admission: EM | Admit: 2012-10-27 | Discharge: 2012-10-30 | DRG: 444 | Disposition: A | Discharge status: Home / Self Care | Payer: Medicare Other | Attending: Internal Medicine | Admitting: Internal Medicine

## 2012-10-27 ENCOUNTER — Telehealth: Payer: Self-pay | Admitting: Family Medicine

## 2012-10-27 DIAGNOSIS — Z905 Acquired absence of kidney: Secondary | ICD-10-CM

## 2012-10-27 DIAGNOSIS — K831 Obstruction of bile duct: Secondary | ICD-10-CM

## 2012-10-27 DIAGNOSIS — Z8639 Personal history of other endocrine, nutritional and metabolic disease: Secondary | ICD-10-CM

## 2012-10-27 DIAGNOSIS — B9620 Unspecified Escherichia coli [E. coli] as the cause of diseases classified elsewhere: Secondary | ICD-10-CM | POA: Diagnosis present

## 2012-10-27 DIAGNOSIS — K219 Gastro-esophageal reflux disease without esophagitis: Secondary | ICD-10-CM | POA: Diagnosis present

## 2012-10-27 DIAGNOSIS — K8689 Other specified diseases of pancreas: Secondary | ICD-10-CM | POA: Diagnosis present

## 2012-10-27 DIAGNOSIS — R1011 Right upper quadrant pain: Secondary | ICD-10-CM

## 2012-10-27 DIAGNOSIS — J4489 Other specified chronic obstructive pulmonary disease: Secondary | ICD-10-CM | POA: Diagnosis present

## 2012-10-27 DIAGNOSIS — I10 Essential (primary) hypertension: Secondary | ICD-10-CM | POA: Diagnosis present

## 2012-10-27 DIAGNOSIS — Q453 Other congenital malformations of pancreas and pancreatic duct: Secondary | ICD-10-CM

## 2012-10-27 DIAGNOSIS — A498 Other bacterial infections of unspecified site: Secondary | ICD-10-CM | POA: Diagnosis present

## 2012-10-27 DIAGNOSIS — Z79899 Other long term (current) drug therapy: Secondary | ICD-10-CM

## 2012-10-27 DIAGNOSIS — Z9884 Bariatric surgery status: Secondary | ICD-10-CM

## 2012-10-27 DIAGNOSIS — K862 Cyst of pancreas: Secondary | ICD-10-CM | POA: Diagnosis present

## 2012-10-27 DIAGNOSIS — K439 Ventral hernia without obstruction or gangrene: Secondary | ICD-10-CM | POA: Diagnosis present

## 2012-10-27 DIAGNOSIS — F17200 Nicotine dependence, unspecified, uncomplicated: Secondary | ICD-10-CM

## 2012-10-27 DIAGNOSIS — E876 Hypokalemia: Secondary | ICD-10-CM | POA: Diagnosis not present

## 2012-10-27 DIAGNOSIS — M79605 Pain in left leg: Secondary | ICD-10-CM

## 2012-10-27 DIAGNOSIS — K869 Disease of pancreas, unspecified: Secondary | ICD-10-CM | POA: Diagnosis present

## 2012-10-27 DIAGNOSIS — K5900 Constipation, unspecified: Secondary | ICD-10-CM | POA: Diagnosis not present

## 2012-10-27 DIAGNOSIS — R7989 Other specified abnormal findings of blood chemistry: Secondary | ICD-10-CM | POA: Diagnosis present

## 2012-10-27 DIAGNOSIS — R1905 Periumbilic swelling, mass or lump: Secondary | ICD-10-CM

## 2012-10-27 DIAGNOSIS — M130 Polyarthritis, unspecified: Secondary | ICD-10-CM | POA: Diagnosis present

## 2012-10-27 DIAGNOSIS — Z0000 Encounter for general adult medical examination without abnormal findings: Secondary | ICD-10-CM

## 2012-10-27 DIAGNOSIS — K838 Other specified diseases of biliary tract: Secondary | ICD-10-CM | POA: Diagnosis present

## 2012-10-27 DIAGNOSIS — G609 Hereditary and idiopathic neuropathy, unspecified: Secondary | ICD-10-CM | POA: Diagnosis present

## 2012-10-27 DIAGNOSIS — F3289 Other specified depressive episodes: Secondary | ICD-10-CM | POA: Diagnosis present

## 2012-10-27 DIAGNOSIS — R634 Abnormal weight loss: Secondary | ICD-10-CM | POA: Diagnosis present

## 2012-10-27 DIAGNOSIS — K5730 Diverticulosis of large intestine without perforation or abscess without bleeding: Secondary | ICD-10-CM | POA: Diagnosis present

## 2012-10-27 DIAGNOSIS — D3500 Benign neoplasm of unspecified adrenal gland: Secondary | ICD-10-CM | POA: Diagnosis present

## 2012-10-27 DIAGNOSIS — M7522 Bicipital tendinitis, left shoulder: Secondary | ICD-10-CM

## 2012-10-27 DIAGNOSIS — R252 Cramp and spasm: Secondary | ICD-10-CM

## 2012-10-27 DIAGNOSIS — H9190 Unspecified hearing loss, unspecified ear: Secondary | ICD-10-CM | POA: Diagnosis present

## 2012-10-27 DIAGNOSIS — J449 Chronic obstructive pulmonary disease, unspecified: Secondary | ICD-10-CM | POA: Diagnosis present

## 2012-10-27 DIAGNOSIS — R7881 Bacteremia: Secondary | ICD-10-CM | POA: Diagnosis present

## 2012-10-27 DIAGNOSIS — H269 Unspecified cataract: Secondary | ICD-10-CM | POA: Diagnosis present

## 2012-10-27 DIAGNOSIS — K810 Acute cholecystitis: Principal | ICD-10-CM | POA: Diagnosis present

## 2012-10-27 DIAGNOSIS — IMO0002 Reserved for concepts with insufficient information to code with codable children: Secondary | ICD-10-CM

## 2012-10-27 DIAGNOSIS — K819 Cholecystitis, unspecified: Secondary | ICD-10-CM

## 2012-10-27 DIAGNOSIS — M069 Rheumatoid arthritis, unspecified: Secondary | ICD-10-CM | POA: Diagnosis present

## 2012-10-27 DIAGNOSIS — F329 Major depressive disorder, single episode, unspecified: Secondary | ICD-10-CM

## 2012-10-27 DIAGNOSIS — E119 Type 2 diabetes mellitus without complications: Secondary | ICD-10-CM | POA: Diagnosis present

## 2012-10-27 DIAGNOSIS — R531 Weakness: Secondary | ICD-10-CM | POA: Diagnosis present

## 2012-10-27 DIAGNOSIS — Z87891 Personal history of nicotine dependence: Secondary | ICD-10-CM

## 2012-10-27 DIAGNOSIS — E43 Unspecified severe protein-calorie malnutrition: Secondary | ICD-10-CM | POA: Diagnosis present

## 2012-10-27 DIAGNOSIS — K820 Obstruction of gallbladder: Secondary | ICD-10-CM | POA: Diagnosis present

## 2012-10-27 HISTORY — DX: Chronic obstructive pulmonary disease, unspecified: J44.9

## 2012-10-27 LAB — HEPATIC FUNCTION PANEL
AST: 186 U/L — ABNORMAL HIGH (ref 0–37)
Albumin: 3.2 g/dL — ABNORMAL LOW (ref 3.5–5.2)
Alkaline Phosphatase: 369 U/L — ABNORMAL HIGH (ref 39–117)
Total Bilirubin: 1.6 mg/dL — ABNORMAL HIGH (ref 0.3–1.2)

## 2012-10-27 LAB — GLUCOSE, CAPILLARY: Glucose-Capillary: 168 mg/dL — ABNORMAL HIGH (ref 70–99)

## 2012-10-27 LAB — CBC WITH DIFFERENTIAL/PLATELET
HCT: 38.3 % (ref 36.0–46.0)
Hemoglobin: 13.5 g/dL (ref 12.0–15.0)
Lymphs Abs: 0.3 10*3/uL — ABNORMAL LOW (ref 0.7–4.0)
Monocytes Relative: 7 % (ref 3–12)
Neutro Abs: 8.3 10*3/uL — ABNORMAL HIGH (ref 1.7–7.7)
Neutrophils Relative %: 90 % — ABNORMAL HIGH (ref 43–77)
RBC: 4.37 MIL/uL (ref 3.87–5.11)

## 2012-10-27 LAB — BASIC METABOLIC PANEL
BUN: 18 mg/dL (ref 6–23)
CO2: 26 mEq/L (ref 19–32)
Chloride: 97 mEq/L (ref 96–112)
Glucose, Bld: 226 mg/dL — ABNORMAL HIGH (ref 70–99)
Potassium: 3.3 mEq/L — ABNORMAL LOW (ref 3.5–5.1)

## 2012-10-27 LAB — URINALYSIS, ROUTINE W REFLEX MICROSCOPIC
Glucose, UA: 100 mg/dL — AB
Hgb urine dipstick: NEGATIVE
Nitrite: NEGATIVE
Specific Gravity, Urine: 1.019 (ref 1.005–1.030)
pH: 6.5 (ref 5.0–8.0)

## 2012-10-27 LAB — URINE MICROSCOPIC-ADD ON

## 2012-10-27 MED ORDER — SODIUM CHLORIDE 0.9 % IV BOLUS (SEPSIS)
1000.0000 mL | Freq: Once | INTRAVENOUS | Status: AC
  Administered 2012-10-27: 1000 mL via INTRAVENOUS

## 2012-10-27 MED ORDER — ROPINIROLE HCL 1 MG PO TABS
1.0000 mg | ORAL_TABLET | Freq: Every day | ORAL | Status: DC
  Administered 2012-10-27 – 2012-10-29 (×3): 1 mg via ORAL
  Filled 2012-10-27 (×4): qty 1

## 2012-10-27 MED ORDER — ONDANSETRON HCL 4 MG/2ML IJ SOLN: 4.0000 mg | Freq: Three times a day (TID) | INTRAMUSCULAR | Status: AC | PRN

## 2012-10-27 MED ORDER — IOHEXOL 300 MG/ML  SOLN
80.0000 mL | Freq: Once | INTRAMUSCULAR | Status: AC | PRN
  Administered 2012-10-27: 80 mL via INTRAVENOUS

## 2012-10-27 MED ORDER — HEPARIN SODIUM (PORCINE) 5000 UNIT/ML IJ SOLN
5000.0000 [IU] | Freq: Three times a day (TID) | INTRAMUSCULAR | Status: DC
  Administered 2012-10-27 – 2012-10-30 (×9): 5000 [IU] via SUBCUTANEOUS
  Filled 2012-10-27 (×11): qty 1

## 2012-10-27 MED ORDER — IOHEXOL 300 MG/ML  SOLN
25.0000 mL | INTRAMUSCULAR | Status: AC
  Administered 2012-10-27: 25 mL via ORAL

## 2012-10-27 MED ORDER — ENALAPRIL MALEATE 5 MG PO TABS
5.0000 mg | ORAL_TABLET | Freq: Every day | ORAL | Status: DC
  Administered 2012-10-28: 5 mg via ORAL
  Filled 2012-10-27 (×2): qty 1

## 2012-10-27 MED ORDER — DEXTROSE-NACL 5-0.9 % IV SOLN
INTRAVENOUS | Status: DC
  Administered 2012-10-27: 23:00:00 via INTRAVENOUS

## 2012-10-27 MED ORDER — IBUPROFEN 800 MG PO TABS
800.0000 mg | ORAL_TABLET | Freq: Once | ORAL | Status: AC
  Administered 2012-10-27: 800 mg via ORAL
  Filled 2012-10-27: qty 1

## 2012-10-27 MED ORDER — HYDROMORPHONE HCL PF 1 MG/ML IJ SOLN: 1.0000 mg | INTRAMUSCULAR | Status: AC | PRN

## 2012-10-27 MED ORDER — SODIUM CHLORIDE 0.9 % IV SOLN
INTRAVENOUS | Status: AC
  Administered 2012-10-27: 23:00:00 via INTRAVENOUS

## 2012-10-27 MED ORDER — TRAMADOL HCL 50 MG PO TABS
25.0000 mg | ORAL_TABLET | Freq: Four times a day (QID) | ORAL | Status: DC | PRN
  Administered 2012-10-27: 25 mg via ORAL
  Filled 2012-10-27 (×2): qty 1

## 2012-10-27 MED ORDER — DEXTROSE 5 % IV SOLN
1.0000 g | Freq: Two times a day (BID) | INTRAVENOUS | Status: DC
  Administered 2012-10-27 – 2012-10-30 (×6): 1 g via INTRAVENOUS
  Filled 2012-10-27 (×7): qty 1

## 2012-10-27 MED ORDER — ONDANSETRON HCL 4 MG PO TABS: 4.0000 mg | ORAL_TABLET | Freq: Four times a day (QID) | ORAL | Status: DC | PRN

## 2012-10-27 MED ORDER — ONDANSETRON HCL 4 MG/2ML IJ SOLN: 4.0000 mg | Freq: Four times a day (QID) | INTRAMUSCULAR | Status: DC | PRN

## 2012-10-27 MED ORDER — GABAPENTIN 300 MG PO CAPS
300.0000 mg | ORAL_CAPSULE | Freq: Three times a day (TID) | ORAL | Status: DC
  Administered 2012-10-27 – 2012-10-30 (×9): 300 mg via ORAL
  Filled 2012-10-27 (×10): qty 1

## 2012-10-27 MED ORDER — FOLIC ACID 1 MG PO TABS
1.0000 mg | ORAL_TABLET | Freq: Every day | ORAL | Status: DC
  Administered 2012-10-27 – 2012-10-28 (×2): 1 mg via ORAL
  Filled 2012-10-27 (×2): qty 1

## 2012-10-27 NOTE — ED Notes (Signed)
Per EMS pt sts generalized weakness this AM. Pt has same episode 2 days ago and was seen for it sts "liver tests were high" chest xray was clear. Pt c/o diffuse abdominal pain, pt c/o slight headache and dizziness. Denies n/v/d

## 2012-10-27 NOTE — ED Notes (Signed)
Md in room with pt at this time. Discussing dx and admission

## 2012-10-27 NOTE — ED Notes (Signed)
Pt taken for CT scan at this time

## 2012-10-27 NOTE — ED Notes (Signed)
Patient transported to CT 

## 2012-10-27 NOTE — ED Notes (Signed)
Pt back in room - with family at bedside
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
## 2012-10-27 NOTE — Telephone Encounter (Signed)
Patient Information:  Caller Name: Jonny Ruiz  Phone: [phone]  Patient: [name] [name], [name] [name]  Gender: Female  DOB: 05/07/1946  Age: 67 Years  PCP: Eustaquio Boyden [facility])  Office Follow Up:  Does the office need to follow up with this patient?: No  Instructions For The Office: N/A  RN Note:  Husband calling to inform of 911 transport.  Symptoms  Reason For Call & Symptoms: Husband calling to let us know wife is being transported now (1145) to ER by ambulance for weakness and lethargy.  Reviewed Health History In EMR: N/A  Reviewed Medications In EMR: N/A  Reviewed Allergies In EMR: N/A  Reviewed Surgeries / Procedures: N/A  Date of Onset of Symptoms: 10/27/2012  Guideline(s) Used:  No Protocol Available - Sick Adult  Disposition Per Guideline:   Call EMS 911 Now  Reason For Disposition Reached:   Sounds like a life-threatening emergency to the triager  Advice Given:  N/A  Patient Will Follow Care Advice:  YES

## 2012-10-27 NOTE — ED Notes (Signed)
Contrast given. 1 cup at 1400, 1 cup at 1500 scan to be done at 1600- pt updated on plan.

## 2012-10-27 NOTE — Telephone Encounter (Signed)
Agree.  Will await ER eval.

## 2012-10-27 NOTE — Telephone Encounter (Signed)
Noted. Thanks.  Will await records.  

## 2012-10-27 NOTE — Telephone Encounter (Signed)
Dr Sharen Hones advised pt can be evaluated at ED since feeling worse and liver function test elevated possibly due to methotrexate. [name] is taking pt to [facility] ED now. Spoke with Mardella Layman nurse at [facility] ED to advise pt will be coming to ED for eval and liver function test done 10/26/12 was elevated.

## 2012-10-27 NOTE — Telephone Encounter (Signed)
pts husband said pt seen 10/26/12; today pt is worse, pt hurts all over ? RA, pt does not want to eat but is drinking,more weakness, staggers when tries to walk,pt wants to sleep all the time but can wake pt up. Pt knows her husband and where she is. Now BP 177/82 P 121. No complaint of h/a, dizziness, CP or SOB. Pt's husband wants to know if should take to ED.Please advise.

## 2012-10-28 ENCOUNTER — Observation Stay (HOSPITAL_COMMUNITY): Payer: Medicare Other

## 2012-10-28 DIAGNOSIS — F17200 Nicotine dependence, unspecified, uncomplicated: Secondary | ICD-10-CM

## 2012-10-28 DIAGNOSIS — Z862 Personal history of diseases of the blood and blood-forming organs and certain disorders involving the immune mechanism: Secondary | ICD-10-CM

## 2012-10-28 DIAGNOSIS — K819 Cholecystitis, unspecified: Secondary | ICD-10-CM

## 2012-10-28 DIAGNOSIS — K869 Disease of pancreas, unspecified: Secondary | ICD-10-CM

## 2012-10-28 DIAGNOSIS — R1011 Right upper quadrant pain: Secondary | ICD-10-CM

## 2012-10-28 DIAGNOSIS — E876 Hypokalemia: Secondary | ICD-10-CM | POA: Diagnosis not present

## 2012-10-28 DIAGNOSIS — M069 Rheumatoid arthritis, unspecified: Secondary | ICD-10-CM

## 2012-10-28 LAB — CBC
HCT: 35.7 % — ABNORMAL LOW (ref 36.0–46.0)
MCH: 30.2 pg (ref 26.0–34.0)
MCV: 88.4 fL (ref 78.0–100.0)
Platelets: 117 10*3/uL — ABNORMAL LOW (ref 150–400)
RBC: 4.04 MIL/uL (ref 3.87–5.11)
WBC: 4.8 10*3/uL (ref 4.0–10.5)

## 2012-10-28 LAB — COMPREHENSIVE METABOLIC PANEL
AST: 163 U/L — ABNORMAL HIGH (ref 0–37)
Albumin: 2.6 g/dL — ABNORMAL LOW (ref 3.5–5.2)
BUN: 10 mg/dL (ref 6–23)
Chloride: 101 mEq/L (ref 96–112)
Creatinine, Ser: 0.61 mg/dL (ref 0.50–1.10)
Potassium: 3 mEq/L — ABNORMAL LOW (ref 3.5–5.1)
Total Bilirubin: 2.4 mg/dL — ABNORMAL HIGH (ref 0.3–1.2)
Total Protein: 5.6 g/dL — ABNORMAL LOW (ref 6.0–8.3)

## 2012-10-28 LAB — GLUCOSE, CAPILLARY
Glucose-Capillary: 170 mg/dL — ABNORMAL HIGH (ref 70–99)
Glucose-Capillary: 179 mg/dL — ABNORMAL HIGH (ref 70–99)

## 2012-10-28 MED ORDER — HYDROMORPHONE HCL PF 1 MG/ML IJ SOLN
1.0000 mg | INTRAMUSCULAR | Status: DC | PRN
  Administered 2012-10-28 – 2012-10-30 (×5): 1 mg via INTRAVENOUS
  Filled 2012-10-28 (×6): qty 1

## 2012-10-28 MED ORDER — GADOBENATE DIMEGLUMINE 529 MG/ML IV SOLN
10.0000 mL | Freq: Once | INTRAVENOUS | Status: AC | PRN
  Administered 2012-10-28: 10 mL via INTRAVENOUS

## 2012-10-28 MED ORDER — POTASSIUM CHLORIDE 10 MEQ/100ML IV SOLN
10.0000 meq | INTRAVENOUS | Status: AC
  Administered 2012-10-28 (×4): 10 meq via INTRAVENOUS
  Filled 2012-10-28 (×5): qty 100

## 2012-10-28 MED ORDER — ACETAMINOPHEN 325 MG PO TABS
650.0000 mg | ORAL_TABLET | Freq: Four times a day (QID) | ORAL | Status: DC | PRN
  Administered 2012-10-30: 650 mg via ORAL
  Filled 2012-10-28: qty 2

## 2012-10-28 MED ORDER — INSULIN ASPART 100 UNIT/ML ~~LOC~~ SOLN
0.0000 [IU] | SUBCUTANEOUS | Status: DC
  Administered 2012-10-28: 2 [IU] via SUBCUTANEOUS
  Administered 2012-10-29: 1 [IU] via SUBCUTANEOUS
  Administered 2012-10-29: 2 [IU] via SUBCUTANEOUS

## 2012-10-28 MED ORDER — HYDRALAZINE HCL 20 MG/ML IJ SOLN
10.0000 mg | Freq: Four times a day (QID) | INTRAMUSCULAR | Status: DC | PRN
  Administered 2012-10-28: 10 mg via INTRAVENOUS
  Filled 2012-10-28: qty 1

## 2012-10-28 MED ORDER — POTASSIUM CHLORIDE IN NACL 20-0.9 MEQ/L-% IV SOLN
INTRAVENOUS | Status: DC
  Administered 2012-10-28 – 2012-10-29 (×2): via INTRAVENOUS
  Filled 2012-10-28 (×5): qty 1000

## 2012-10-28 MED ORDER — IBUPROFEN 400 MG PO TABS
400.0000 mg | ORAL_TABLET | Freq: Four times a day (QID) | ORAL | Status: DC | PRN
  Administered 2012-10-28: 400 mg via ORAL
  Filled 2012-10-28: qty 1

## 2012-10-28 MED ORDER — NICOTINE 21 MG/24HR TD PT24
21.0000 mg | MEDICATED_PATCH | Freq: Every day | TRANSDERMAL | Status: DC
  Administered 2012-10-28 – 2012-10-30 (×3): 21 mg via TRANSDERMAL
  Filled 2012-10-28 (×3): qty 1

## 2012-10-28 MED ORDER — LORAZEPAM 2 MG/ML IJ SOLN
1.0000 mg | Freq: Once | INTRAMUSCULAR | Status: AC | PRN
  Administered 2012-10-28: 1 mg via INTRAVENOUS
  Filled 2012-10-28: qty 1

## 2012-10-28 MED ORDER — VANCOMYCIN HCL IN DEXTROSE 1-5 GM/200ML-% IV SOLN
1000.0000 mg | INTRAVENOUS | Status: DC
  Filled 2012-10-28 (×2): qty 200
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
## 2012-10-28 NOTE — Progress Notes (Signed)
CRITICAL VALUE ALERT  Critical value received:  Gram negative rods  Date of notification:  10/28/2012  Time of notification:  0530 pm   Critical value read back: yes  Nurse who received alert:  Barnett Applebaum, RN BC, BSN, MSN  MD notified (1st page):  Dr. Lendell Caprice  Time of first page:  0540 pm  MD notified (2nd page):  Time of second page:  Responding MD:  Dr. Lendell Caprice  Time MD responded:  545 pm

## 2012-10-28 NOTE — Progress Notes (Signed)
Received call from Amy Rusca with [facility] regarding critical lab value. Patient has positive culture: gram positive cocci in clusters from th aerobic bottle. Notified Dr. Lendell Caprice. New orders to be entered.

## 2012-10-28 NOTE — Progress Notes (Signed)
Chart reviewed.  Discussed with radiology. Discussed with Dr. Claudette Head.  [facility] PROGRESS NOTE  [name] [name] ZOX:[id number] DOB: [birth year] DOA: 10/27/2012 PCP: Eustaquio Boyden, MD  Assessment/Plan:    Acute cholecystitis without calculus: continue cefotetan. We will need eventual cholecystectomy, but needs pancreatic and liver issues clarified first.      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 Await MRCP which should be done today. Have consulted Dr. Russella Dar. Patient has seen [facility] in the past for screening colonoscopy.    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  if ERCP is needed, she would likely need to be transferred to [facility] or other academic center where proper equipment is available.    Loss of weight    History of diabetes mellitus, type II: Diet controlled since her gastric bypass. We'll give sliding scale for now. Check hemoglobin A1c.    HYPERTENSION: Meds held   rheumatoid arthritis: Remicade and methotrexate held     Weakness   Code Status: full Family Communication: none today Disposition Plan: ?   Consultants:  Joline Salt  Procedures:    Antibiotics:  Cefotetan 10/27/12  HPI/Subjective: Wondering when she can be. Feels hungry. Some upper abdominal pain. No nausea.  starting getting sick several months ago.   Objective: Filed Vitals:   10/27/12 2203 10/28/12 0228 10/28/12 0236 10/28/12 0408  BP: 140/60 175/90 193/60 142/60  Pulse: 70 108 97 93  Temp: 98.8 F (37.1 C) 99.8 F (37.7 C) 102.9 F (39.4 C) 102.1 F (38.9 C)  TempSrc: Oral Oral Oral Oral  Resp: 18 20  18   Height: 5' 2.5" (1.588 m)     Weight: 50 kg (110 lb 3.7 oz)     SpO2: 97%   94%    Intake/Output Summary (Last 24 hours) at 10/28/12 0953 Last data filed at 10/28/12 0649  Gross per 24 hour  Intake 815.01 ml  Output      0 ml  Net 815.01 ml    Filed Weights   10/27/12 1214 10/27/12 2203  Weight: 48.535 kg (107 lb) 50 kg (110 lb 3.7 oz)    Exam:   General:  Comfortable. Nontoxic appearing.   Cardiovascular: regular rate rhythm without murmurs gallops rubs   Respiratory: lungs clear to auscultation bilaterally without wheeze rhonchi or rales   Abdomen: soft. Nondistended. Right upper quadrant and epigastrium is tender. Bowel sounds present.   Extremities: No clubbing cyanosis or edema   Data Reviewed: Basic Metabolic Panel:  Recent Labs Lab 10/26/12 0949 10/27/12 1209 10/28/12 0630  NA 137 134* 137  K 3.7 3.3* 3.0*  CL 100 97 101  CO2 30 26 25   GLUCOSE 116* 226* 185*  BUN 22 18 10   CREATININE 0.9 0.68 0.61  CALCIUM 8.9 8.7 8.4   Liver Function Tests:  Recent Labs Lab 10/26/12 0949 10/27/12 1233 10/28/12 0630  AST 57* 186* 163*  ALT 140* 241* 209*  ALKPHOS 239* 369* 373*  BILITOT 1.4* 1.6* 2.4*  PROT 6.5 6.2 5.6*  ALBUMIN 3.5 3.2* 2.6*    Recent Labs Lab 10/27/12 1233  LIPASE 80*   No results found for this basename: AMMONIA,  in the last 168 hours CBC:  Recent Labs Lab 10/26/12 0949 10/27/12 1209 10/28/12 0630  WBC 7.3 9.3 4.8  NEUTROABS 5.7 8.3*  --   HGB 14.5 13.5 12.2  HCT 43.0  38.3 35.7*  MCV 92.2 87.6 88.4  PLT 150.0 122* 117*   Cardiac Enzymes: No results found for this basename: CKTOTAL, CKMB, CKMBINDEX, TROPONINI,  in the last 168 hours BNP (last 3 results) No results found for this basename: PROBNP,  in the last 8760 hours CBG:  Recent Labs Lab 10/27/12 1312  GLUCAP 168*    No results found for this or any previous visit (from the past 240 hour(s)).   Studies: Dg Chest 2 View  10/27/2012   *RADIOLOGY REPORT*  Clinical Data: Weakness and dizziness  CHEST - 2 VIEW  Comparison: 10/26/2012  Findings: Midline trachea.  Normal heart size.  Aortic atherosclerosis which is age advanced. No pleural effusion or pneumothorax.  Mild interstitial thickening.  Favor  artifactual densities over the upper lobes.  Probable calcified granuloma the right lung base, unchanged since 1 day prior and 05/18/2005.  IMPRESSION: 1.  No acute cardiopulmonary disease. 2.  Mild peribronchial thickening which may relate to chronic bronchitis or smoking.   Original Report Authenticated By: Jeronimo Greaves, M.D.   Dg Chest 2 View  10/26/2012   *RADIOLOGY REPORT*  Clinical Data: Cough.  Shortness of breath.  Smoker.  Weight loss.  CHEST - 2 VIEW  Comparison: 05/07/2009  Findings: Heart size is normal.  Pulmonary hyperinflation is consistent with COPD.  No evidence of pulmonary infiltrate or pleural effusion.  No mass or lymphadenopathy identified.  IMPRESSION: COPD.  No active disease.   Original Report Authenticated By: Myles Rosenthal, M.D.   US Abdomen Complete  10/27/2012   *RADIOLOGY REPORT*  Clinical Data:  Distended gallbladder with pericholecystic fluid.  COMPLETE ABDOMINAL ULTRASOUND  Comparison:  CT scan dated 10/27/2012 and ultrasound dated 12/14/2005  Findings:  Gallbladder:  There is extensive sludge in the gallbladder with thickening of the gallbladder wall.  No stones.  Common bile duct:  The proximal common bile duct is dilated to a diameter of 11 mm.  The distal duct is not seen.  I am concerned that the patient might have a small mass in the head of the pancreas obstructing the duct.  Liver:  Normal except for slight dilatationof the bile ducts.  IVC:  Normal.  Pancreas:  Increased dilatation of the pancreatic duct in the body and tail to a diameter 4.8 mm.  The duct tapers to a normal size and region of the head of the pancreas.  Spleen:  Normal.  7.6 cm in length.  Right Kidney:  Removed.  Left Kidney:  Normal.  13.0 cm in length.  Abdominal aorta:  Normal.  1.9 cm maximum diameter.  IMPRESSION: Distended gallbladder with thickened gallbladder wall and pericholecystic fluid suggesting acalculous cholecystitis.  Dilated intra and extrahepatic bile ducts.  I am concerned about the  possibility of a mass in the head of the pancreas.   Original Report Authenticated By: Francene Boyers, M.D.   Ct Abdomen Pelvis W Contrast  10/27/2012   *RADIOLOGY REPORT*  Clinical Data: Generalized weakness and malaise.  Elevated liver function tests.  History of nephrectomy and gastric bypass.  CT ABDOMEN AND PELVIS WITH CONTRAST  Technique:  Multidetector CT imaging of the abdomen and pelvis was performed following the standard protocol during bolus administration of intravenous contrast.  Contrast: 80mL OMNIPAQUE IOHEXOL 300 MG/ML  SOLN  Comparison: 09/05/2012 and 12/23/2009.  Findings: Lung bases:  Calcified granuloma at the right lung base. There is also minimal subpleural nodularity which is unchanged and favored to be related to subpleural lymph nodes.  Mild nodularity  at the left lung base, also likely related to subpleural lymph nodes.  Normal heart size without pericardial or pleural effusion.  Small hiatal hernia.  Abdomen/pelvis:  Suspicion of mild hepatic steatosis, without focal liver lesion.  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  Status post antecolic Roux-en-Y gastric bypass.  Moderate pancreatic atrophy with borderline ductal dilatation within the body.  Subtle area of hypoattenuation within the pancreatic head suspected on image 25/series 2.  The intrahepatic ducts are mildly prominent, new.  The common duct measures 1.2 cm on image 22 coronal.  There is apparent gallbladder wall thickening versus focal pericholecystic edema. Gallbladder distention.  Example image 29/series 2.  No calcified gallstones.  Separate origins of the splenic artery and common hepatic artery. Prominent porta hepatis nodes are likely reactive.  Normal right adrenal gland.  A left adrenal nodule measures 2.5 cm and fat density, consistent with a myelolipoma.  Normal appearance of the left kidney.  Status post right nephrectomy.  Circumaortic left renal vein. No retroperitoneal or retrocrural adenopathy.  Normal colon,  appendix, and terminal ileum.  Normal caliber of small bowel loops, without ascites. No pneumatosis or free intraperitoneal air.  Prior midline laparotomy.  Fat density lesion within the left upper abdominal greater omentum measures 3.4 cm versus 3.6 cm on the prior. No pelvic adenopathy. Hysterectomy.  Normal urinary bladder. No adnexal mass or significant free fluid.  A fat containing left paracentral pelvic hernia.  Bones/Musculoskeletal:  No acute osseous abnormality.  Degenerative disc disease at the lumbosacral junction on the right.  IMPRESSION:  1.  Findings highly suspicious for acute cholecystitis. Depending on clinical concern, ultrasound could confirm. 2.  Subtle pancreatic findings for which pancreatic head adenocarcinoma cannot be excluded.  When the patient is improved from the acute clinical situation, dedicated pre and post contrast abdominal MRI is recommended. 3.  Subtle biliary ductal dilatation.  This could be related to the pancreatic process or an otherwise occult common duct stone.  This would also be best evaluated with MRI to include MRCP. 4.  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 5.  Left adrenal myelolipoma.   Original Report Authenticated By: Jeronimo Greaves, M.D.    Scheduled Meds: . cefoTEtan (CEFOTAN) IV  1 g Intravenous Q12H  . enalapril  5 mg Oral Daily  . folic acid  1 mg Oral Daily  . gabapentin  300 mg Oral TID  . heparin  5,000 Units Subcutaneous Q8H  . rOPINIRole  1 mg Oral QHS   Continuous Infusions: . dextrose 5 % and 0.9% NaCl 100 mL/hr at 10/27/12 2317   Time spent: 50 minutes   Roshan Roback L  [facility] Pager [phone] If 7PM-7AM, please contact night-coverage at [email or web address], password [facility] 10/28/2012, 9:53 AM  LOS: 1 day
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
## 2012-10-28 NOTE — Progress Notes (Signed)
Dr. Lendell Caprice notified of patient K+ level of 3.0.

## 2012-10-28 NOTE — Progress Notes (Signed)
Subjective: Abdominal pain has improved somewhat  Objective: Vital signs in last 24 hours: Temp:  [97.6 F (36.4 C)-103 F (39.4 C)] 102.1 F (38.9 C) (05/17 0408) Pulse Rate:  [65-108] 93 (05/17 0408) Resp:  [15-22] 18 (05/17 0408) BP: (101-193)/(39-90) 142/60 mmHg (05/17 0408) SpO2:  [92 %-99 %] 94 % (05/17 0408) Weight:  [48.535 kg (107 lb)-50 kg (110 lb 3.7 oz)] 50 kg (110 lb 3.7 oz) (05/16 2203) Last BM Date: 10/26/12  Intake/Output from previous day: 05/16 0701 - 05/17 0700 In: 815 [I.V.:765; IV Piggyback:50] Out: -  Intake/Output this shift:    General appearance: alert and cooperative Resp: clear to auscultation bilaterally Cardio: S1, S2 normal GI: soft, mild RUQ TTP, no generalized tenderness Neurologic: Mental status: Alert, oriented, thought content appropriate  Lab Results:   Recent Labs  10/27/12 1209 10/28/12 0630  WBC 9.3 4.8  HGB 13.5 12.2  HCT 38.3 35.7*  PLT 122* 117*   BMET  Recent Labs  10/27/12 1209 10/28/12 0630  NA 134* 137  K 3.3* 3.0*  CL 97 101  CO2 26 25  GLUCOSE 226* 185*  BUN 18 10  CREATININE 0.68 0.61  CALCIUM 8.7 8.4   PT/INR No results found for this basename: LABPROT, INR,  in the last 72 hours ABG No results found for this basename: PHART, PCO2, PO2, HCO3,  in the last 72 hours  Studies/Results: Dg Chest 2 View  10/27/2012   *RADIOLOGY REPORT*  Clinical Data: Weakness and dizziness  CHEST - 2 VIEW  Comparison: 10/26/2012  Findings: Midline trachea.  Normal heart size.  Aortic atherosclerosis which is age advanced. No pleural effusion or pneumothorax.  Mild interstitial thickening.  Favor artifactual densities over the upper lobes.  Probable calcified granuloma the right lung base, unchanged since 1 day prior and 05/18/2005.  IMPRESSION: 1.  No acute cardiopulmonary disease. 2.  Mild peribronchial thickening which may relate to chronic bronchitis or smoking.   Original Report Authenticated By: Jeronimo Greaves, M.D.    US Abdomen Complete  10/27/2012   *RADIOLOGY REPORT*  Clinical Data:  Distended gallbladder with pericholecystic fluid.  COMPLETE ABDOMINAL ULTRASOUND  Comparison:  CT scan dated 10/27/2012 and ultrasound dated 12/14/2005  Findings:  Gallbladder:  There is extensive sludge in the gallbladder with thickening of the gallbladder wall.  No stones.  Common bile duct:  The proximal common bile duct is dilated to a diameter of 11 mm.  The distal duct is not seen.  I am concerned that the patient might have a small mass in the head of the pancreas obstructing the duct.  Liver:  Normal except for slight dilatationof the bile ducts.  IVC:  Normal.  Pancreas:  Increased dilatation of the pancreatic duct in the body and tail to a diameter 4.8 mm.  The duct tapers to a normal size and region of the head of the pancreas.  Spleen:  Normal.  7.6 cm in length.  Right Kidney:  Removed.  Left Kidney:  Normal.  13.0 cm in length.  Abdominal aorta:  Normal.  1.9 cm maximum diameter.  IMPRESSION: Distended gallbladder with thickened gallbladder wall and pericholecystic fluid suggesting acalculous cholecystitis.  Dilated intra and extrahepatic bile ducts.  I am concerned about the possibility of a mass in the head of the pancreas.   Original Report Authenticated By: Francene Boyers, M.D.   Ct Abdomen Pelvis W Contrast  10/27/2012   *RADIOLOGY REPORT*  Clinical Data: Generalized weakness and malaise.  Elevated liver function tests.  History of nephrectomy and gastric bypass.  CT ABDOMEN AND PELVIS WITH CONTRAST  Technique:  Multidetector CT imaging of the abdomen and pelvis was performed following the standard protocol during bolus administration of intravenous contrast.  Contrast: 80mL OMNIPAQUE IOHEXOL 300 MG/ML  SOLN  Comparison: 09/05/2012 and 12/23/2009.  Findings: Lung bases:  Calcified granuloma at the right lung base. There is also minimal subpleural nodularity which is unchanged and favored to be related to subpleural lymph  nodes.  Mild nodularity at the left lung base, also likely related to subpleural lymph nodes.  Normal heart size without pericardial or pleural effusion.  Small hiatal hernia.  Abdomen/pelvis:  Suspicion of mild hepatic steatosis, without focal liver lesion.  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  Status post antecolic Roux-en-Y gastric bypass.  Moderate pancreatic atrophy with borderline ductal dilatation within the body.  Subtle area of hypoattenuation within the pancreatic head suspected on image 25/series 2.  The intrahepatic ducts are mildly prominent, new.  The common duct measures 1.2 cm on image 22 coronal.  There is apparent gallbladder wall thickening versus focal pericholecystic edema. Gallbladder distention.  Example image 29/series 2.  No calcified gallstones.  Separate origins of the splenic artery and common hepatic artery. Prominent porta hepatis nodes are likely reactive.  Normal right adrenal gland.  A left adrenal nodule measures 2.5 cm and fat density, consistent with a myelolipoma.  Normal appearance of the left kidney.  Status post right nephrectomy.  Circumaortic left renal vein. No retroperitoneal or retrocrural adenopathy.  Normal colon, appendix, and terminal ileum.  Normal caliber of small bowel loops, without ascites. No pneumatosis or free intraperitoneal air.  Prior midline laparotomy.  Fat density lesion within the left upper abdominal greater omentum measures 3.4 cm versus 3.6 cm on the prior. No pelvic adenopathy. Hysterectomy.  Normal urinary bladder. No adnexal mass or significant free fluid.  A fat containing left paracentral pelvic hernia.  Bones/Musculoskeletal:  No acute osseous abnormality.  Degenerative disc disease at the lumbosacral junction on the right.  IMPRESSION:  1.  Findings highly suspicious for acute cholecystitis. Depending on clinical concern, ultrasound could confirm. 2.  Subtle pancreatic findings for which pancreatic head adenocarcinoma cannot be excluded.   When the patient is improved from the acute clinical situation, dedicated pre and post contrast abdominal MRI is recommended. 3.  Subtle biliary ductal dilatation.  This could be related to the pancreatic process or an otherwise occult common duct stone.  This would also be best evaluated with MRI to include MRCP. 4.  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 5.  Left adrenal myelolipoma.   Original Report Authenticated By: Jeronimo Greaves, M.D.    Anti-infectives: Anti-infectives   Start     Dose/Rate Route Frequency Ordered Stop   10/27/12 2200  cefoTEtan (CEFOTAN) 1 g in dextrose 5 % 50 mL IVPB     1 g 100 mL/hr over 30 Minutes Intravenous Every 12 hours 10/27/12 2158        Assessment/Plan: Distended GB with biliary dilatation and suggestion of a mass in the head of the pancreas - labs P, MR also P.   Will follow and plan accordingly pending results.  I D/W patient.  LOS: 1 day    Seylah Wernert E 10/28/2012

## 2012-10-29 DIAGNOSIS — Z9884 Bariatric surgery status: Secondary | ICD-10-CM

## 2012-10-29 DIAGNOSIS — B9689 Other specified bacterial agents as the cause of diseases classified elsewhere: Secondary | ICD-10-CM

## 2012-10-29 DIAGNOSIS — R7881 Bacteremia: Secondary | ICD-10-CM

## 2012-10-29 LAB — LIPASE, BLOOD: Lipase: 49 U/L (ref 11–59)

## 2012-10-29 LAB — GLUCOSE, CAPILLARY
Glucose-Capillary: 107 mg/dL — ABNORMAL HIGH (ref 70–99)
Glucose-Capillary: 118 mg/dL — ABNORMAL HIGH (ref 70–99)
Glucose-Capillary: 134 mg/dL — ABNORMAL HIGH (ref 70–99)
Glucose-Capillary: 169 mg/dL — ABNORMAL HIGH (ref 70–99)
Glucose-Capillary: 60 mg/dL — ABNORMAL LOW (ref 70–99)
Glucose-Capillary: 92 mg/dL (ref 70–99)
Glucose-Capillary: 95 mg/dL (ref 70–99)

## 2012-10-29 LAB — COMPREHENSIVE METABOLIC PANEL
ALT: 162 U/L — ABNORMAL HIGH (ref 0–35)
Alkaline Phosphatase: 386 U/L — ABNORMAL HIGH (ref 39–117)
BUN: 6 mg/dL (ref 6–23)
CO2: 24 mEq/L (ref 19–32)
GFR calc Af Amer: >90 mL/min (ref >90)
GFR calc non Af Amer: >90 mL/min (ref >90)
Glucose, Bld: 111 mg/dL — ABNORMAL HIGH (ref 70–99)
Potassium: 3.5 mEq/L (ref 3.5–5.1)
Sodium: 136 mEq/L (ref 135–145)
Total Bilirubin: 1.8 mg/dL — ABNORMAL HIGH (ref 0.3–1.2)
Total Protein: 5.8 g/dL — ABNORMAL LOW (ref 6.0–8.3)

## 2012-10-29 LAB — CBC WITH DIFFERENTIAL/PLATELET
Eosinophils Absolute: 0.1 10*3/uL (ref 0.0–0.7)
Eosinophils Relative: 4 % (ref 0–5)
Hemoglobin: 12.5 g/dL (ref 12.0–15.0)
Lymphocytes Relative: 25 % (ref 12–46)
Lymphs Abs: 0.9 10*3/uL (ref 0.7–4.0)
MCH: 30.3 pg (ref 26.0–34.0)
MCV: 88.1 fL (ref 78.0–100.0)
Monocytes Relative: 14 % — ABNORMAL HIGH (ref 3–12)
Neutrophils Relative %: 58 % (ref 43–77)
Platelets: 129 10*3/uL — ABNORMAL LOW (ref 150–400)
RBC: 4.12 MIL/uL (ref 3.87–5.11)
WBC: 3.7 10*3/uL — ABNORMAL LOW (ref 4.0–10.5)

## 2012-10-29 LAB — APTT: aPTT: 35 seconds (ref 24–37)

## 2012-10-29 MED ORDER — MAGNESIUM SULFATE 40 MG/ML IJ SOLN
2.0000 g | Freq: Once | INTRAMUSCULAR | Status: AC
  Administered 2012-10-29: 2 g via INTRAVENOUS
  Filled 2012-10-29: qty 50

## 2012-10-29 MED ORDER — PANTOPRAZOLE SODIUM 40 MG PO TBEC
40.0000 mg | DELAYED_RELEASE_TABLET | Freq: Every day | ORAL | Status: DC
  Administered 2012-10-29 – 2012-10-30 (×2): 40 mg via ORAL
  Filled 2012-10-29 (×2): qty 1

## 2012-10-29 NOTE — Progress Notes (Signed)
[facility] Progress Note   Subjective  Feels better. Still having abd pain. Gram neg rods in blood culture noted.   Objective  Vital signs in last 24 hours: Temp:  [97.5 F (36.4 C)-98.6 F (37 C)] 98.2 F (36.8 C) (05/18 0412) Pulse Rate:  [62-67] 65 (05/18 0412) Resp:  [18] 18 (05/18 0412) BP: (123-165)/(56-64) 165/64 mmHg (05/18 0412) SpO2:  [95 %-98 %] 95 % (05/18 0412) Weight:  [110 lb 7.2 oz (50.1 kg)] 110 lb 7.2 oz (50.1 kg) (05/17 2001) Last BM Date: 10/28/12 General:   Alert, well-developed, thin white female in NAD Heart:  Regular rate and rhythm; no murmurs Abdomen:  Soft, mild RUQ tenderness and nondistended. Normal bowel sounds, without guarding, and without rebound.   Extremities:  Without edema. Neurologic:  Alert and  oriented x4;  grossly normal neurologically. Psych:  Alert and cooperative. Normal mood and affect.  Intake/Output from previous day: 05/17 0701 - 05/18 0700 In: 2580 [P.O.:600; I.V.:1880; IV Piggyback:100] Out: -  Intake/Output this shift:    Lab Results:  Recent Labs  10/27/12 1209 10/28/12 0630 10/29/12 0558  WBC 9.3 4.8 3.7*  HGB 13.5 12.2 12.5  HCT 38.3 35.7* 36.3  PLT 122* 117* 129*   BMET  Recent Labs  10/27/12 1209 10/28/12 0630 10/29/12 0558  NA 134* 137 136  K 3.3* 3.0* 3.5  CL 97 101 99  CO2 26 25 24   GLUCOSE 226* 185* 111*  BUN 18 10 6   CREATININE 0.68 0.61 0.49*  CALCIUM 8.7 8.4 9.0   LFT  Recent Labs  10/27/12 1233  10/29/12 0558  PROT 6.2  < > 5.8*  ALBUMIN 3.2*  < > 2.6*  AST 186*  < > 64*  ALT 241*  < > 162*  ALKPHOS 369*  < > 386*  BILITOT 1.6*  < > 1.8*  BILIDIR 0.7*  --   --   IBILI 0.9  --   --   < > = values in this interval not displayed. PT/INR  Recent Labs  10/29/12 0558  LABPROT 12.4  INR 0.93   Studies/Results: Dg Chest 2 View  10/27/2012   *RADIOLOGY REPORT*  Clinical Data: Weakness and dizziness  CHEST - 2 VIEW  Comparison: 10/26/2012  Findings: Midline trachea.   Normal heart size.  Aortic atherosclerosis which is age advanced. No pleural effusion or pneumothorax.  Mild interstitial thickening.  Favor artifactual densities over the upper lobes.  Probable calcified granuloma the right lung base, unchanged since 1 day prior and 05/18/2005.  IMPRESSION: 1.  No acute cardiopulmonary disease. 2.  Mild peribronchial thickening which may relate to chronic bronchitis or smoking.   Original Report Authenticated By: Jeronimo Greaves, M.D.   US Abdomen Complete  10/27/2012   *RADIOLOGY REPORT*  Clinical Data:  Distended gallbladder with pericholecystic fluid.  COMPLETE ABDOMINAL ULTRASOUND  Comparison:  CT scan dated 10/27/2012 and ultrasound dated 12/14/2005  Findings:  Gallbladder:  There is extensive sludge in the gallbladder with thickening of the gallbladder wall.  No stones.  Common bile duct:  The proximal common bile duct is dilated to a diameter of 11 mm.  The distal duct is not seen.  I am concerned that the patient might have a small mass in the head of the pancreas obstructing the duct.  Liver:  Normal except for slight dilatationof the bile ducts.  IVC:  Normal.  Pancreas:  Increased dilatation of the pancreatic duct in the body and tail to a diameter 4.8 mm.  The duct tapers to a normal size and region of the head of the pancreas.  Spleen:  Normal.  7.6 cm in length.  Right Kidney:  Removed.  Left Kidney:  Normal.  13.0 cm in length.  Abdominal aorta:  Normal.  1.9 cm maximum diameter.  IMPRESSION: Distended gallbladder with thickened gallbladder wall and pericholecystic fluid suggesting acalculous cholecystitis.  Dilated intra and extrahepatic bile ducts.  I am concerned about the possibility of a mass in the head of the pancreas.   Original Report Authenticated By: Francene Boyers, M.D.   Ct Abdomen Pelvis W Contrast  10/27/2012   *RADIOLOGY REPORT*  Clinical Data: Generalized weakness and malaise.  Elevated liver function tests.  History of nephrectomy and gastric  bypass.  CT ABDOMEN AND PELVIS WITH CONTRAST  Technique:  Multidetector CT imaging of the abdomen and pelvis was performed following the standard protocol during bolus administration of intravenous contrast.  Contrast: 80mL OMNIPAQUE IOHEXOL 300 MG/ML  SOLN  Comparison: 09/05/2012 and 12/23/2009.  Findings: Lung bases:  Calcified granuloma at the right lung base. There is also minimal subpleural nodularity which is unchanged and favored to be related to subpleural lymph nodes.  Mild nodularity at the left lung base, also likely related to subpleural lymph nodes.  Normal heart size without pericardial or pleural effusion.  Small hiatal hernia.  Abdomen/pelvis:  Suspicion of mild hepatic steatosis, without focal liver lesion.  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  Status post antecolic Roux-en-Y gastric bypass.  Moderate pancreatic atrophy with borderline ductal dilatation within the body.  Subtle area of hypoattenuation within the pancreatic head suspected on image 25/series 2.  The intrahepatic ducts are mildly prominent, new.  The common duct measures 1.2 cm on image 22 coronal.  There is apparent gallbladder wall thickening versus focal pericholecystic edema. Gallbladder distention.  Example image 29/series 2.  No calcified gallstones.  Separate origins of the splenic artery and common hepatic artery. Prominent porta hepatis nodes are likely reactive.  Normal right adrenal gland.  A left adrenal nodule measures 2.5 cm and fat density, consistent with a myelolipoma.  Normal appearance of the left kidney.  Status post right nephrectomy.  Circumaortic left renal vein. No retroperitoneal or retrocrural adenopathy.  Normal colon, appendix, and terminal ileum.  Normal caliber of small bowel loops, without ascites. No pneumatosis or free intraperitoneal air.  Prior midline laparotomy.  Fat density lesion within the left upper abdominal greater omentum measures 3.4 cm versus 3.6 cm on the prior. No pelvic adenopathy.  Hysterectomy.  Normal urinary bladder. No adnexal mass or significant free fluid.  A fat containing left paracentral pelvic hernia.  Bones/Musculoskeletal:  No acute osseous abnormality.  Degenerative disc disease at the lumbosacral junction on the right.  IMPRESSION:  1.  Findings highly suspicious for acute cholecystitis. Depending on clinical concern, ultrasound could confirm. 2.  Subtle pancreatic findings for which pancreatic head adenocarcinoma cannot be excluded.  When the patient is improved from the acute clinical situation, dedicated pre and post contrast abdominal MRI is recommended. 3.  Subtle biliary ductal dilatation.  This could be related to the pancreatic process or an otherwise occult common duct stone.  This would also be best evaluated with MRI to include MRCP. 4.  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 5.  Left adrenal myelolipoma.   Original Report Authenticated By: Jeronimo Greaves, M.D.      Assessment & Plan   1. Abdominal pain and fever  in setting of ultrasound suggesting acute cholecystitis. She is on IV antibiotics, will need surgical consult. Gram negative rods in blood. Advance diet to full liquids as tolerated.  2. Abnormal LFTs (mixed pattern) with dilated intra/ extrahepatic biliary dilation and pancreatic duct dilation. CT scan suggests subtle area of hypoattenuation within the pancreatic head. She had a CT scan late March which revealed " possible 1.3 x 1.3 cm cystic lesion in the uncinate process". A pancreatic lesion needs to be ruled out but it should be noted that her pancreatic duct dilation has existed for at least 3 years (CT scan July 2011). Her LFT abnormalities and biliary dilation are new findings. No gallstones but gallbladder sludge seen on imaging studies. Rule out choledocholithiasis. Rule out neoplasm, especially given unexplained weight loss. MRCP completed but not yet read. Further recommendations pending based  on MRCP. If ERCP is needed patient may be best served at [facility] or [facility] since papilla usually difficult to access in patients who have a Roux-en-Y.   3. History of Roux-en-Y bypass 4 years ago   4. Fat filled ventral hernia. She is followed by [facility] for this.   5. Rheumatoid arthritis, on Remicade and Methotrexate can cause liver damage, patient's LFTs have been previously normal.    LOS: 2 days   Judie Petit T. Russella Dar MD [facility]  10/29/2012, 10:08 AM
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
## 2012-10-29 NOTE — Progress Notes (Addendum)
Notes reviewed.  Yesterday, initial erroneous report of GPC in clusters. Has GNR in blood.  Chart reviewed.  Discussed with radiology. Discussed with Dr. Claudette Head.  [facility] PROGRESS NOTE  [name] [name] AVW:[id number] DOB: 10-13-1945 DOA: 10/27/2012 PCP: Eustaquio Boyden, MD  Assessment/Plan:  Gram-negative bacteremia secondary to below. Patient is improving on cefotetan. Will continue for now. Await culture results.    Acute cholecystitis without calculus: continue cefotetan. Await recommendations from GI. An RCP results back. See below. Diet has been advanced to full liquids.     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  if ERCP is needed, she would likely need to be transferred to [facility] or other academic center where proper equipment is available.    Loss of weight    History of diabetes mellitus, type II: Diet controlled since her gastric bypass. We'll give sliding scale for now. Check hemoglobin A1c.    HYPERTENSION: Meds held   rheumatoid arthritis: Remicade and methotrexate held     Weakness  Gastroesophageal reflux disease. Will order PPI per patient's request.  Code Status: full Family Communication: none today Disposition Plan: ?   Consultants:  Joline Salt  Antibiotics:  Cefotetan 10/27/12  HPI/Subjective: Did well with clear liquids yesterday. Had dry heaves this morning. Still having upper abdominal pain. Nausea resolved currently. Fevers chills and sweats improved. Requests Prilosec. Takes it at home.  Objective: Filed Vitals:   10/28/12 1704 10/28/12 2001 10/29/12 0412 10/29/12 1039  BP: 123/56 139/61 165/64 143/69  Pulse: 67 62 65 79  Temp: 98.6 F (37 C) 97.5 F (36.4 C) 98.2 F (36.8 C) 98.6 F (37 C)  TempSrc: Oral Oral Oral Oral  Resp: 18 18 18 18   Height:      Weight:  50.1 kg (110 lb 7.2 oz)     SpO2: 96% 96% 95% 99%    Intake/Output Summary (Last 24 hours) at 10/29/12 1108 Last data filed at 10/29/12 0603  Gross per 24 hour  Intake   2530 ml  Output      0 ml  Net   2530 ml   Filed Weights   10/27/12 1214 10/27/12 2203 10/28/12 2001  Weight: 48.535 kg (107 lb) 50 kg (110 lb 3.7 oz) 50.1 kg (110 lb 7.2 oz)    Exam:   General:  Comfortable. Nontoxic appearing. Seated in a chair  Cardiovascular: regular rate rhythm without murmurs gallops rubs   Respiratory: lungs clear to auscultation bilaterally without wheeze rhonchi or rales   Abdomen: soft. Nondistended. Right upper quadrant and epigastrium is tender. Bowel sounds present.   Extremities: No clubbing cyanosis or edema   Data Reviewed: Basic Metabolic Panel:  Recent Labs Lab 10/26/12 0949 10/27/12 1209 10/28/12 0630 10/29/12 0558  NA 137 134* 137 136  K 3.7 3.3* 3.0* 3.5  CL 100 97 101 99  CO2 30 26 25 24   GLUCOSE 116* 226* 185* 111*  BUN 22 18 10 6   CREATININE 0.9 0.68 0.61 0.49*  CALCIUM 8.9 8.7 8.4 9.0  MG  --   --  1.5  --    Liver Function Tests:  Recent Labs Lab 10/26/12 0949 10/27/12 1233 10/28/12 0630 10/29/12 0558  AST 57* 186* 163* 64*  ALT 140* 241* 209* 162*  ALKPHOS 239* 369* 373* 386*  BILITOT 1.4* 1.6* 2.4* 1.8*  PROT 6.5  6.2 5.6* 5.8*  ALBUMIN 3.5 3.2* 2.6* 2.6*    Recent Labs Lab 10/27/12 1233 10/29/12 0558  LIPASE 80* 49   No results found for this basename: AMMONIA,  in the last 168 hours CBC:  Recent Labs Lab 10/26/12 0949 10/27/12 1209 10/28/12 0630 10/29/12 0558  WBC 7.3 9.3 4.8 3.7*  NEUTROABS 5.7 8.3*  --  2.1  HGB 14.5 13.5 12.2 12.5  HCT 43.0 38.3 35.7* 36.3  MCV 92.2 87.6 88.4 88.1  PLT 150.0 122* 117* 129*   Cardiac Enzymes: No results found for this basename: CKTOTAL, CKMB, CKMBINDEX, TROPONINI,  in the last 168 hours BNP (last 3 results) No results found for this basename: PROBNP,  in the last 8760 hours CBG:  Recent Labs Lab  10/28/12 1703 10/28/12 1959 10/29/12 0018 10/29/12 0414 10/29/12 0923  GLUCAP 170* 179* 118* 92 169*    Recent Results (from the past 240 hour(s))  CULTURE, BLOOD (ROUTINE X 2)     Status: None   Collection Time    10/27/12  1:51 PM      Result Value Range Status   Specimen Description BLOOD ARM RIGHT   Final   Special Requests BOTTLES DRAWN AEROBIC AND ANAEROBIC 10CC   Final   Culture  Setup Time 10/27/2012 23:43   Final   Culture     Final   Value:        BLOOD CULTURE RECEIVED NO GROWTH TO DATE CULTURE WILL BE HELD FOR 5 DAYS BEFORE ISSUING A FINAL NEGATIVE REPORT   Report Status PENDING   Incomplete  CULTURE, BLOOD (ROUTINE X 2)     Status: None   Collection Time    10/27/12  1:59 PM      Result Value Range Status   Specimen Description BLOOD HAND RIGHT   Final   Special Requests     Final   Value: BOTTLES DRAWN AEROBIC AND ANAEROBIC 10CCBLUE 9CCRED   Culture  Setup Time 10/27/2012 23:43   Final   Culture     Final   Value: GRAM NEGATIVE RODS     Note: Gram Stain Report Called to,Read Back By and Verified With: VALENCIA MCKNIGHT 10/28/12 @ 5:23PM BY RUSCA.   Report Status PENDING   Incomplete     Studies: Dg Chest 2 View  10/27/2012   *RADIOLOGY REPORT*  Clinical Data: Weakness and dizziness  CHEST - 2 VIEW  Comparison: 10/26/2012  Findings: Midline trachea.  Normal heart size.  Aortic atherosclerosis which is age advanced. No pleural effusion or pneumothorax.  Mild interstitial thickening.  Favor artifactual densities over the upper lobes.  Probable calcified granuloma the right lung base, unchanged since 1 day prior and 05/18/2005.  IMPRESSION: 1.  No acute cardiopulmonary disease. 2.  Mild peribronchial thickening which may relate to chronic bronchitis or smoking.   Original Report Authenticated By: Jeronimo Greaves, M.D.   US Abdomen Complete  10/27/2012   *RADIOLOGY REPORT*  Clinical Data:  Distended gallbladder with pericholecystic fluid.  COMPLETE ABDOMINAL ULTRASOUND   Comparison:  CT scan dated 10/27/2012 and ultrasound dated 12/14/2005  Findings:  Gallbladder:  There is extensive sludge in the gallbladder with thickening of the gallbladder wall.  No stones.  Common bile duct:  The proximal common bile duct is dilated to a diameter of 11 mm.  The distal duct is not seen.  I am concerned that the patient might have a small mass in the head of the pancreas obstructing the duct.  Liver:  Normal  except for slight dilatationof the bile ducts.  IVC:  Normal.  Pancreas:  Increased dilatation of the pancreatic duct in the body and tail to a diameter 4.8 mm.  The duct tapers to a normal size and region of the head of the pancreas.  Spleen:  Normal.  7.6 cm in length.  Right Kidney:  Removed.  Left Kidney:  Normal.  13.0 cm in length.  Abdominal aorta:  Normal.  1.9 cm maximum diameter.  IMPRESSION: Distended gallbladder with thickened gallbladder wall and pericholecystic fluid suggesting acalculous cholecystitis.  Dilated intra and extrahepatic bile ducts.  I am concerned about the possibility of a mass in the head of the pancreas.   Original Report Authenticated By: Francene Boyers, M.D.   Ct Abdomen Pelvis W Contrast  10/27/2012   *RADIOLOGY REPORT*  Clinical Data: Generalized weakness and malaise.  Elevated liver function tests.  History of nephrectomy and gastric bypass.  CT ABDOMEN AND PELVIS WITH CONTRAST  Technique:  Multidetector CT imaging of the abdomen and pelvis was performed following the standard protocol during bolus administration of intravenous contrast.  Contrast: 80mL OMNIPAQUE IOHEXOL 300 MG/ML  SOLN  Comparison: 09/05/2012 and 12/23/2009.  Findings: Lung bases:  Calcified granuloma at the right lung base. There is also minimal subpleural nodularity which is unchanged and favored to be related to subpleural lymph nodes.  Mild nodularity at the left lung base, also likely related to subpleural lymph nodes.  Normal heart size without pericardial or pleural effusion.   Small hiatal hernia.  Abdomen/pelvis:  Suspicion of mild hepatic steatosis, without focal liver lesion.  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  Status post antecolic Roux-en-Y gastric bypass.  Moderate pancreatic atrophy with borderline ductal dilatation within the body.  Subtle area of hypoattenuation within the pancreatic head suspected on image 25/series 2.  The intrahepatic ducts are mildly prominent, new.  The common duct measures 1.2 cm on image 22 coronal.  There is apparent gallbladder wall thickening versus focal pericholecystic edema. Gallbladder distention.  Example image 29/series 2.  No calcified gallstones.  Separate origins of the splenic artery and common hepatic artery. Prominent porta hepatis nodes are likely reactive.  Normal right adrenal gland.  A left adrenal nodule measures 2.5 cm and fat density, consistent with a myelolipoma.  Normal appearance of the left kidney.  Status post right nephrectomy.  Circumaortic left renal vein. No retroperitoneal or retrocrural adenopathy.  Normal colon, appendix, and terminal ileum.  Normal caliber of small bowel loops, without ascites. No pneumatosis or free intraperitoneal air.  Prior midline laparotomy.  Fat density lesion within the left upper abdominal greater omentum measures 3.4 cm versus 3.6 cm on the prior. No pelvic adenopathy. Hysterectomy.  Normal urinary bladder. No adnexal mass or significant free fluid.  A fat containing left paracentral pelvic hernia.  Bones/Musculoskeletal:  No acute osseous abnormality.  Degenerative disc disease at the lumbosacral junction on the right.  IMPRESSION:  1.  Findings highly suspicious for acute cholecystitis. Depending on clinical concern, ultrasound could confirm. 2.  Subtle pancreatic findings for which pancreatic head adenocarcinoma cannot be excluded.  When the patient is improved from the acute clinical situation, dedicated pre and post contrast abdominal MRI is recommended. 3.  Subtle biliary ductal  dilatation.  This could be related to the pancreatic process or an otherwise occult common duct stone.  This would also be best evaluated with MRI to include MRCP. 4.  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 5.  Left adrenal myelolipoma.   Original Report Authenticated By: Jeronimo Greaves, M.D.   Mr 3d Recon At Scanner  10/29/2012   *RADIOLOGY REPORT*  Clinical Data:  Gastric bypass 4 years ago.  24 by weight loss over last several months.  Hypertension.  Diabetes.  Elevated liver function tests.  MRI ABDOMEN WITHOUT AND WITH CONTRAST (INCLUDING MRCP)  Technique:  Multiplanar multisequence MR imaging of the abdomen was performed both before and after the administration of intravenous contrast. Heavily T2-weighted images of the biliary and pancreatic ducts were obtained, and three-dimensional MRCP images were rendered by post processing.  Contrast: 10mL MULTIHANCE GADOBENATE DIMEGLUMINE 529 MG/ML IV SOLN  Comparison:  The CT of 10/27/2012.  Ultrasound of 10/27/2012.  Findings:  Portions of exam are mildly motion degraded. Normal heart size without pericardial or pleural effusion.  No focal liver lesion. Arterial heterogeneous hepatic enhancement is favored to be due to hyperemia from the inflamed gallbladder.  Mild intrahepatic biliary ductal dilatation, with the left hepatic duct measuring 7 mm on image 11/series 5. New since 2011.  Normal spleen.  Status post gastric bypass.  The gallbladder remains dilated with wall thickening and mucosal hyperenhancement. There is mild pericystic edema and fluid.  No stones identified.  The mid common duct measures 1.3 cm on transverse image 16/series 5 and image 10/series 700. This is newly enlarged since 12/23/2009. There is no evidence of choledocholithiasis. In the region of the possible pancreatic head mass, the duct undergoes a mild transition, measuring 9 mm in the region of the preampullary pancreatic uncinate process on  image 18/series 5.  This portion of the duct is similar to on the 12/23/2009 exam.  There is moderate pancreatic atrophy and mild pancreatic ductal dilatation.  The ductal dilatation undergoes a transition in the region of the pancreatic head, including on images 17/series 5 and image 68/series 7.  In this region, there is a subtle area of postcontrast hypoenhancement suspected which measures 1.5 x 2.0 cm on image 58/series 11,302.  Possible restricted diffusion in this area on image 6/series 900.  The vascularity is suboptimally evaluated secondary motion.  There is a separate origin of the celiac axis and common hepatic artery. The superior mesenteric vein is possibly contacted by the pancreatic head lesion on image 58/series 1302.  No circumferential contact.  No abdominal adenopathy or ascites.  IMPRESSION:  1.  Findings which remain suspicious for acalculous cholecystitis. 2.  Motion degraded exam.  This degrades evaluation of the pancreatic head. 3.  Combination of subtle findings, including transition from dilated to normal caliber pancreatic and common duct in the region of the pancreatic head.  Cannot exclude underlying non border deforming hypoenhancing pancreatic lesion (i.e. adenocarcinoma). A benign stricture could look similar.  Potential clinical strategies include further evaluation with ERCP and possible endoscopic ultrasound.  If the patient is not a good ERCP candidate, follow-up cross-sectional imaging with dedicated pancreatic protocol CT or MRI could be performed when the patient is clinically stable and able to hold breath ( i.e. as an outpatient). 4.  If the patient is eventually diagnosed with pancreatic adenocarcinoma via endoscopic ultrasound, and surgery is a consideration, dedicated pancreatic protocol CT should be considered to entirely evaluate the superior mesenteric vein, given motion limitation on the current exam.   Original Report Authenticated By: Jeronimo Greaves, M.D.   Mr Abd  W/wo Cm/mrcp  10/29/2012   *RADIOLOGY REPORT*  Clinical Data:  Gastric bypass 4 years ago.  24 by weight  loss over last several months.  Hypertension.  Diabetes.  Elevated liver function tests.  MRI ABDOMEN WITHOUT AND WITH CONTRAST (INCLUDING MRCP)  Technique:  Multiplanar multisequence MR imaging of the abdomen was performed both before and after the administration of intravenous contrast. Heavily T2-weighted images of the biliary and pancreatic ducts were obtained, and three-dimensional MRCP images were rendered by post processing.  Contrast: 10mL MULTIHANCE GADOBENATE DIMEGLUMINE 529 MG/ML IV SOLN  Comparison:  The CT of 10/27/2012.  Ultrasound of 10/27/2012.  Findings:  Portions of exam are mildly motion degraded. Normal heart size without pericardial or pleural effusion.  No focal liver lesion. Arterial heterogeneous hepatic enhancement is favored to be due to hyperemia from the inflamed gallbladder.  Mild intrahepatic biliary ductal dilatation, with the left hepatic duct measuring 7 mm on image 11/series 5. New since 2011.  Normal spleen.  Status post gastric bypass.  The gallbladder remains dilated with wall thickening and mucosal hyperenhancement. There is mild pericystic edema and fluid.  No stones identified.  The mid common duct measures 1.3 cm on transverse image 16/series 5 and image 10/series 700. This is newly enlarged since 12/23/2009. There is no evidence of choledocholithiasis. In the region of the possible pancreatic head mass, the duct undergoes a mild transition, measuring 9 mm in the region of the preampullary pancreatic uncinate process on image 18/series 5.  This portion of the duct is similar to on the 12/23/2009 exam.  There is moderate pancreatic atrophy and mild pancreatic ductal dilatation.  The ductal dilatation undergoes a transition in the region of the pancreatic head, including on images 17/series 5 and image 68/series 7.  In this region, there is a subtle area of postcontrast  hypoenhancement suspected which measures 1.5 x 2.0 cm on image 58/series 11,302.  Possible restricted diffusion in this area on image 6/series 900.  The vascularity is suboptimally evaluated secondary motion.  There is a separate origin of the celiac axis and common hepatic artery. The superior mesenteric vein is possibly contacted by the pancreatic head lesion on image 58/series 1302.  No circumferential contact.  No abdominal adenopathy or ascites.  IMPRESSION:  1.  Findings which remain suspicious for acalculous cholecystitis. 2.  Motion degraded exam.  This degrades evaluation of the pancreatic head. 3.  Combination of subtle findings, including transition from dilated to normal caliber pancreatic and common duct in the region of the pancreatic head.  Cannot exclude underlying non border deforming hypoenhancing pancreatic lesion (i.e. adenocarcinoma). A benign stricture could look similar.  Potential clinical strategies include further evaluation with ERCP and possible endoscopic ultrasound.  If the patient is not a good ERCP candidate, follow-up cross-sectional imaging with dedicated pancreatic protocol CT or MRI could be performed when the patient is clinically stable and able to hold breath ( i.e. as an outpatient). 4.  If the patient is eventually diagnosed with pancreatic adenocarcinoma via endoscopic ultrasound, and surgery is a consideration, dedicated pancreatic protocol CT should be considered to entirely evaluate the superior mesenteric vein, given motion limitation on the current exam.   Original Report Authenticated By: Jeronimo Greaves, M.D.    Scheduled Meds: . cefoTEtan (CEFOTAN) IV  1 g Intravenous Q12H  . gabapentin  300 mg Oral TID  . heparin  5,000 Units Subcutaneous Q8H  . insulin aspart  0-9 Units Subcutaneous Q4H  . nicotine  21 mg Transdermal Daily  . pantoprazole  40 mg Oral Daily  . rOPINIRole  1 mg Oral QHS   Continuous Infusions: .  0.9 % NaCl with KCl 20 mEq / L 100 mL/hr at  10/28/12 1115   Time spent: 50 minutes   Mallie Giambra L  [facility] Pager [phone] If 7PM-7AM, please contact night-coverage at [email or web address], password [facility] 10/29/2012, 11:08 AM  LOS: 2 days

## 2012-10-29 NOTE — Progress Notes (Signed)
Hypoglycemic Event  CBG: 60  Treatment: 15 GM carbohydrate snack  Symptoms: None  Follow-up CBG: Time:2223 CBG Result:95  Possible Reasons for Event: Inadequate meal intake  Comments/MD notified:none    [name] [name] C  Remember to initiate Hypoglycemia Order Set & complete

## 2012-10-30 DIAGNOSIS — K831 Obstruction of bile duct: Secondary | ICD-10-CM

## 2012-10-30 LAB — COMPREHENSIVE METABOLIC PANEL
AST: 93 U/L — ABNORMAL HIGH (ref 0–37)
Albumin: 2.7 g/dL — ABNORMAL LOW (ref 3.5–5.2)
BUN: 4 mg/dL — ABNORMAL LOW (ref 6–23)
Calcium: 9 mg/dL (ref 8.4–10.5)
Chloride: 100 mEq/L (ref 96–112)
Creatinine, Ser: 0.57 mg/dL (ref 0.50–1.10)
Total Bilirubin: 3.9 mg/dL — ABNORMAL HIGH (ref 0.3–1.2)
Total Protein: 5.9 g/dL — ABNORMAL LOW (ref 6.0–8.3)

## 2012-10-30 LAB — CULTURE, BLOOD (ROUTINE X 2)

## 2012-10-30 LAB — GLUCOSE, CAPILLARY
Glucose-Capillary: 141 mg/dL — ABNORMAL HIGH (ref 70–99)
Glucose-Capillary: 91 mg/dL (ref 70–99)

## 2012-10-30 MED ORDER — AMOXICILLIN-POT CLAVULANATE 875-125 MG PO TABS
1.0000 | ORAL_TABLET | Freq: Two times a day (BID) | ORAL | Status: DC
  Filled 2012-10-30: qty 1

## 2012-10-30 MED ORDER — FOLIC ACID 1 MG PO TABS
1.0000 mg | ORAL_TABLET | Freq: Every day | ORAL | Status: DC
  Administered 2012-10-30: 1 mg via ORAL
  Filled 2012-10-30: qty 1

## 2012-10-30 MED ORDER — TRAMADOL HCL 50 MG PO TABS
25.0000 mg | ORAL_TABLET | Freq: Two times a day (BID) | ORAL | Status: DC | PRN
  Filled 2012-10-30: qty 1

## 2012-10-30 MED ORDER — AMOXICILLIN-POT CLAVULANATE 875-125 MG PO TABS: 1.0000 | ORAL_TABLET | Freq: Two times a day (BID) | ORAL | Status: DC

## 2012-10-30 MED ORDER — WHITE PETROLATUM GEL
Status: AC
  Filled 2012-10-30: qty 5

## 2012-10-30 MED ORDER — PROMETHAZINE HCL 12.5 MG PO TABS: 12.5000 mg | ORAL_TABLET | Freq: Four times a day (QID) | ORAL | Status: DC | PRN

## 2012-10-30 MED ORDER — OXYCODONE HCL 5 MG PO TABS: 5.0000 mg | ORAL_TABLET | ORAL | Status: DC | PRN

## 2012-10-30 MED ORDER — OXYCODONE HCL 5 MG PO TABS
5.0000 mg | ORAL_TABLET | ORAL | Status: DC | PRN
  Administered 2012-10-30: 5 mg via ORAL
  Administered 2012-10-30: 10 mg via ORAL
  Filled 2012-10-30: qty 2
  Filled 2012-10-30: qty 1

## 2012-10-30 MED ORDER — HYDROMORPHONE HCL PF 1 MG/ML IJ SOLN
1.0000 mg | INTRAMUSCULAR | Status: DC | PRN
  Administered 2012-10-30: 1 mg via INTRAVENOUS
  Filled 2012-10-30: qty 1

## 2012-10-30 NOTE — Progress Notes (Addendum)
Patient screened for [facility] Care Management Program as a benefit of her [facility] Medicare. Patient will receive a post discharge transition of care call upon discharge. Consents signed at bedside and packet left with patient for [facility] Care Management.  Raiford Noble, MSN-Ed, RN,BSN, [facility], [phone]

## 2012-10-30 NOTE — Clinical Documentation Improvement (Signed)
MALNUTRITION DOCUMENTATION CLARIFICATION  THIS DOCUMENT IS NOT A PERMANENT PART OF THE MEDICAL RECORD  TO RESPOND TO THE THIS QUERY, FOLLOW THE INSTRUCTIONS BELOW:  1. If needed, update documentation for the patient's encounter via the notes activity.  2. Access this query again and click edit on the In [employer].  3. After updating, or not, click F2 to complete all highlighted (required) fields concerning your review. Select "additional documentation in the medical record" OR "no additional documentation provided".  4. Click Sign note button.  5. The deficiency will fall out of your In Basket *Please let us know if you are not able to complete this workflow by phone or e-mail (listed below).  Please update your documentation within the medical record to reflect your response to this query.                                                                                        10/30/12   Dear Dr. Paris Lore / Associates,  In a better effort to capture your patient's severity of illness, reflect appropriate length of stay and utilization of resources, a review of the patient medical record has revealed the following indicators.    Based on your clinical judgment, please clarify and document in a progress note and/or discharge summary the clinical condition associated with the following supporting information:  In responding to this query please exercise your independent judgment.  The fact that a query is asked, does not imply that any particular answer is desired or expected.  Possible Clinical Conditions?  Severe Malnutrition   Severe Protein Calorie Malnutrition Other Condition Cannot clinically determine   Supporting Information:  Risk Factors: (As per nutrition assessment):Pt admitted with acute cholecystitis. Pt with hx of Roux-en-y gastric bypass in 2010. Pt with 3-4 month hx of upper abd pain. Per MD note if pt requires an ERCP will need to transfer to tertiary center.    Pt reports about 60 lb weight loss after bariatric surgery. Pt weighed 220 lb at time of surgery   Signs & Symptoms:Pt meets criteria for SEVERE MALNUTRITION in the context of chronic illness as evidenced by 11% weight loss x 6 months and severe muscle wasting  Ht: 5' 2.5"       Wt :110 lb 3.7 oz  BMI: 19.83 kg/(m^2).   Diagnostics: NUTRITION ASSESSMENT  10/28/2012  4:35 PM Kendell Bane RD, LDN, CNSC  Treatment: Supplement diet as appropriate   You may use possible, probable, or suspect with inpatient documentation. possible, probable, suspected diagnoses MUST be documented at the time of discharge  Reviewed: additional documentation in the medical record  Thank You,  Joanette Gula Delk RN, BSN, CCDS Clinical Documentation Specialist: [phone] Pager Health Information Management [place]

## 2012-10-30 NOTE — Progress Notes (Signed)
Agree with above and evaluation for cbd stricture vs tumor

## 2012-10-30 NOTE — Progress Notes (Signed)
Patient discharged to home. Patient AVS reviewed with patient and patient's significant other. Patient given prescriptions and personal medical records to take with her to her appointment at [facility] tomorrow morning. Patient's home medications were returned to her from the pharmacy vault.  Patient verbalized understanding of medications and follow-up appointments.  Patient remains stable; no signs or symptoms of distress.  Patient educated to return to the ER in cases of exacerbation of admitting symptoms, SOB, dizziness, fever, chest pain, or fainting.

## 2012-10-30 NOTE — Discharge Summary (Addendum)
Physician Discharge Summary  Saramarie NECHUMA BOVEN ZOX:[id number] DOB: [birth year] DOA: 10/27/2012  PCP: Eustaquio Boyden, MD  Admit date: 10/27/2012 Discharge date: 10/30/2012  Time spent: 90 minutes  Recommendations for Outpatient Follow-up:  1. 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 female with hx of gastric bypass 4 years ago, lost 25 lbs over past several months, hx of RA on Remicade, HTN, DM, prior smoker, presents to her PCP 2 days ago complaining of weakness, nausea, and RUQ pain. She felt worse with increased pain and nausea, unable to drink much fluid, so she came to the ER. Evaluation in the ER included a fever of 103, elevated LFTs with AST 186, ALT 241, Alk Phos of 370, and total bili of 1.6. She has no leukocytosis. An abdominal pelvic CT showed acute cholecystitis and pancreatic lesion for which pancreatic adenocarcinoma can't be excluded. Hospitalist was asked to admit her for acute cholecystitis and work up of abnormal abdominal CT. Surgery was consulted and will follow.  Hospital Course:  Patient was started on broad spectrum antibiotics.  MRCP results as below.  GI was consulted and recommended ERCP at [facility], as procedure would be difficult and require specialized equipment, given post gastric bypass status. They request patient f/u tomorrow in the office (see below). Patient's  fever defervesced.  She is tolerating small amounts of solids, but still having pain.  She is stable for discharge on antibiotics, with f/u tomorrow at [facility].    Procedures: None, for ERCP at [facility] Thursday.  Consultations: [facility], General Surgery  Discharge Exam: Filed Vitals:   10/30/12 0421 10/30/12 0958 10/30/12 1348 10/30/12 1702  BP: 159/67 138/54 172/69 150/67  Pulse: 80 88  82  Temp: 99.2 F (37.3 C) 99.8 F (37.7 C) 99.4 F (37.4 C) 101.2 F (38.4 C)  TempSrc: Oral Oral Oral Oral  Resp: 18 16  17   Height:      Weight:      SpO2: 92% 93% 97% 98%    General: comfortable, nontoxic, alert and oriented. Abd: RUQ tender Lungs CTA CV RRR  Discharge Instructions  Discharge Orders   Future Appointments Provider Department Dept Phone   11/27/2012 12:00 PM Eustaquio Boyden, MD [facility] [phone]   Future Orders Complete By Expires     Diet general  As directed     Scheduling Instructions:      Low fat diet    Discharge instructions  As directed     Comments:      Follow up with Dr. Winona Legato tomorrow at [address] [place], [place]  At 9 am.  Call Candace for questions. [phone]. 1 pm preoperative evaluation same day.  ERCP will be Wednesday at 10 am.        Medication List    STOP taking these  medications       CALCIUM-MAGNESIUM-ZINC PO     Iron 325 (65 FE) MG Tabs     Magnesium 250 MG Tabs     methotrexate 2.5 MG tablet  Commonly known as:  RHEUMATREX     multivitamin with minerals Tabs     REMICADE IV     traMADol 50 MG tablet  Commonly known as:  ULTRAM      TAKE these medications       amoxicillin-clavulanate 875-125 MG per tablet  Commonly known as:  AUGMENTIN  Take 1 tablet by mouth every 12 (twelve) hours.     enalapril 5 MG tablet  Commonly known as:  VASOTEC  Take 5 mg by mouth daily.     folic acid 1 MG tablet  Commonly known as:  FOLVITE  Take 1 mg by mouth daily.      gabapentin 300 MG capsule  Commonly known as:  NEURONTIN  Take 1 capsule (300 mg total) by mouth 3 (three) times daily.     oxyCODONE 5 MG immediate release tablet  Commonly known as:  Oxy IR/ROXICODONE  Take 1-2 tablets (5-10 mg total) by mouth every 4 (four) hours as needed.     promethazine 12.5 MG tablet  Commonly known as:  PHENERGAN  Take 1 tablet (12.5 mg total) by mouth every 6 (six) hours as needed for nausea.     rOPINIRole 1 MG tablet  Commonly known as:  REQUIP  Take 1 tablet (1 mg total) by mouth at bedtime.     Vitamin B-12 500 MCG Subl  Place 1 tablet under the tongue daily.       Allergies  Allergen Reactions  . Morphine And Related Other (See Comments)    Hallucinations    The results of significant diagnostics from this hospitalization (including imaging, microbiology, ancillary and laboratory) are listed below for reference.    Significant Diagnostic Studies: Dg Chest 2 View  10/27/2012   *RADIOLOGY REPORT*  Clinical Data: Weakness and dizziness  CHEST - 2 VIEW  Comparison: 10/26/2012  Findings: Midline trachea.  Normal heart size.  Aortic atherosclerosis which is age advanced. No pleural effusion or pneumothorax.  Mild interstitial thickening.  Favor artifactual densities over the upper lobes.  Probable calcified granuloma the right lung base, unchanged since 1 day prior and 05/18/2005.  IMPRESSION: 1.  No acute cardiopulmonary disease. 2.  Mild peribronchial thickening which may relate to chronic bronchitis or smoking.   Original Report Authenticated By: Jeronimo Greaves, M.D.   Dg Chest 2 View  10/26/2012   *RADIOLOGY REPORT*  Clinical Data: Cough.  Shortness of breath.  Smoker.  Weight loss.  CHEST - 2 VIEW  Comparison: 05/07/2009  Findings: Heart size is normal.  Pulmonary hyperinflation is consistent with COPD.  No evidence of pulmonary infiltrate or pleural effusion.  No mass or lymphadenopathy identified.  IMPRESSION: COPD.  No active disease.   Original Report  Authenticated By: Myles Rosenthal, M.D.   US Abdomen Complete  10/27/2012   *RADIOLOGY REPORT*  Clinical Data:  Distended gallbladder with pericholecystic fluid.  COMPLETE ABDOMINAL ULTRASOUND  Comparison:  CT scan dated 10/27/2012 and ultrasound dated 12/14/2005  Findings:  Gallbladder:  There is extensive sludge in the gallbladder with thickening of the gallbladder wall.  No stones.  Common bile duct:  The proximal common bile duct is dilated to a diameter of 11 mm.  The distal duct is not seen.  I am concerned that the patient might have a  small mass in the head of the pancreas obstructing the duct.  Liver:  Normal except for slight dilatationof the bile ducts.  IVC:  Normal.  Pancreas:  Increased dilatation of the pancreatic duct in the body and tail to a diameter 4.8 mm.  The duct tapers to a normal size and region of the head of the pancreas.  Spleen:  Normal.  7.6 cm in length.  Right Kidney:  Removed.  Left Kidney:  Normal.  13.0 cm in length.  Abdominal aorta:  Normal.  1.9 cm maximum diameter.  IMPRESSION: Distended gallbladder with thickened gallbladder wall and pericholecystic fluid suggesting acalculous cholecystitis.  Dilated intra and extrahepatic bile ducts.  I am concerned about the possibility of a mass in the head of the pancreas.   Original Report Authenticated By: Francene Boyers, M.D.   Ct Abdomen Pelvis W Contrast  10/27/2012   *RADIOLOGY REPORT*  Clinical Data: Generalized weakness and malaise.  Elevated liver function tests.  History of nephrectomy and gastric bypass.  CT ABDOMEN AND PELVIS WITH CONTRAST  Technique:  Multidetector CT imaging of the abdomen and pelvis was performed following the standard protocol during bolus administration of intravenous contrast.  Contrast: 80mL OMNIPAQUE IOHEXOL 300 MG/ML  SOLN  Comparison: 09/05/2012 and 12/23/2009.  Findings: Lung bases:  Calcified granuloma at the right lung base. There is also minimal subpleural nodularity which is unchanged and favored  to be related to subpleural lymph nodes.  Mild nodularity at the left lung base, also likely related to subpleural lymph nodes.  Normal heart size without pericardial or pleural effusion.  Small hiatal hernia.  Abdomen/pelvis:  Suspicion of mild hepatic steatosis, without focal liver lesion.  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  Status post antecolic Roux-en-Y gastric bypass.  Moderate pancreatic atrophy with borderline ductal dilatation within the body.  Subtle area of hypoattenuation within the pancreatic head suspected on image 25/series 2.  The intrahepatic ducts are mildly prominent, new.  The common duct measures 1.2 cm on image 22 coronal.  There is apparent gallbladder wall thickening versus focal pericholecystic edema. Gallbladder distention.  Example image 29/series 2.  No calcified gallstones.  Separate origins of the splenic artery and common hepatic artery. Prominent porta hepatis nodes are likely reactive.  Normal right adrenal gland.  A left adrenal nodule measures 2.5 cm and fat density, consistent with a myelolipoma.  Normal appearance of the left kidney.  Status post right nephrectomy.  Circumaortic left renal vein. No retroperitoneal or retrocrural adenopathy.  Normal colon, appendix, and terminal ileum.  Normal caliber of small bowel loops, without ascites. No pneumatosis or free intraperitoneal air.  Prior midline laparotomy.  Fat density lesion within the left upper abdominal greater omentum measures 3.4 cm versus 3.6 cm on the prior. No pelvic adenopathy. Hysterectomy.  Normal urinary bladder. No adnexal mass or significant free fluid.  A fat containing left paracentral pelvic hernia.  Bones/Musculoskeletal:  No acute osseous abnormality.  Degenerative disc disease at the lumbosacral junction on the right.  IMPRESSION:  1.  Findings highly suspicious for acute cholecystitis. Depending on clinical concern, ultrasound could confirm. 2.  Subtle pancreatic findings for which pancreatic head  adenocarcinoma cannot be excluded.  When the patient is improved from the acute clinical situation, dedicated pre and post contrast abdominal MRI is recommended. 3.  Subtle biliary ductal dilatation.  This could be related to the pancreatic process or an otherwise occult common duct stone.  This would also be best evaluated with MRI to include  MRCP. 4.  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 5.  Left adrenal myelolipoma.   Original Report Authenticated By: Jeronimo Greaves, M.D.   Mr 3d Recon At Scanner  10/29/2012   *RADIOLOGY REPORT*  Clinical Data:  Gastric bypass 4 years ago.  24 by weight loss over last several months.  Hypertension.  Diabetes.  Elevated liver function tests.  MRI ABDOMEN WITHOUT AND WITH CONTRAST (INCLUDING MRCP)  Technique:  Multiplanar multisequence MR imaging of the abdomen was performed both before and after the administration of intravenous contrast. Heavily T2-weighted images of the biliary and pancreatic ducts were obtained, and three-dimensional MRCP images were rendered by post processing.  Contrast: 10mL MULTIHANCE GADOBENATE DIMEGLUMINE 529 MG/ML IV SOLN  Comparison:  The CT of 10/27/2012.  Ultrasound of 10/27/2012.  Findings:  Portions of exam are mildly motion degraded. Normal heart size without pericardial or pleural effusion.  No focal liver lesion. Arterial heterogeneous hepatic enhancement is favored to be due to hyperemia from the inflamed gallbladder.  Mild intrahepatic biliary ductal dilatation, with the left hepatic duct measuring 7 mm on image 11/series 5. New since 2011.  Normal spleen.  Status post gastric bypass.  The gallbladder remains dilated with wall thickening and mucosal hyperenhancement. There is mild pericystic edema and fluid.  No stones identified.  The mid common duct measures 1.3 cm on transverse image 16/series 5 and image 10/series 700. This is newly enlarged since 12/23/2009. There is no evidence of  choledocholithiasis. In the region of the possible pancreatic head mass, the duct undergoes a mild transition, measuring 9 mm in the region of the preampullary pancreatic uncinate process on image 18/series 5.  This portion of the duct is similar to on the 12/23/2009 exam.  There is moderate pancreatic atrophy and mild pancreatic ductal dilatation.  The ductal dilatation undergoes a transition in the region of the pancreatic head, including on images 17/series 5 and image 68/series 7.  In this region, there is a subtle area of postcontrast hypoenhancement suspected which measures 1.5 x 2.0 cm on image 58/series 11,302.  Possible restricted diffusion in this area on image 6/series 900.  The vascularity is suboptimally evaluated secondary motion.  There is a separate origin of the celiac axis and common hepatic artery. The superior mesenteric vein is possibly contacted by the pancreatic head lesion on image 58/series 1302.  No circumferential contact.  No abdominal adenopathy or ascites.  IMPRESSION:  1.  Findings which remain suspicious for acalculous cholecystitis. 2.  Motion degraded exam.  This degrades evaluation of the pancreatic head. 3.  Combination of subtle findings, including transition from dilated to normal caliber pancreatic and common duct in the region of the pancreatic head.  Cannot exclude underlying non border deforming hypoenhancing pancreatic lesion (i.e. adenocarcinoma). A benign stricture could look similar.  Potential clinical strategies include further evaluation with ERCP and possible endoscopic ultrasound.  If the patient is not a good ERCP candidate, follow-up cross-sectional imaging with dedicated pancreatic protocol CT or MRI could be performed when the patient is clinically stable and able to hold breath ( i.e. as an outpatient). 4.  If the patient is eventually diagnosed with pancreatic adenocarcinoma via endoscopic ultrasound, and surgery is a consideration, dedicated pancreatic  protocol CT should be considered to entirely evaluate the superior mesenteric vein, given motion limitation on the current exam.   Original Report Authenticated By: Jeronimo Greaves, M.D.   Mr Abd W/wo Cm/mrcp  10/29/2012   *  RADIOLOGY REPORT*  Clinical Data:  Gastric bypass 4 years ago.  25 by weight loss over last several months.  Hypertension.  Diabetes.  Elevated liver function tests.  MRI ABDOMEN WITHOUT AND WITH CONTRAST (INCLUDING MRCP)  Technique:  Multiplanar multisequence MR imaging of the abdomen was performed both before and after the administration of intravenous contrast. Heavily T2-weighted images of the biliary and pancreatic ducts were obtained, and three-dimensional MRCP images were rendered by post processing.  Contrast: 10mL MULTIHANCE GADOBENATE DIMEGLUMINE 529 MG/ML IV SOLN  Comparison:  The CT of 10/27/2012.  Ultrasound of 10/27/2012.  Findings:  Portions of exam are mildly motion degraded. Normal heart size without pericardial or pleural effusion.  No focal liver lesion. Arterial heterogeneous hepatic enhancement is favored to be due to hyperemia from the inflamed gallbladder.  Mild intrahepatic biliary ductal dilatation, with the left hepatic duct measuring 7 mm on image 11/series 5. New since 2011.  Normal spleen.  Status post gastric bypass.  The gallbladder remains dilated with wall thickening and mucosal hyperenhancement. There is mild pericystic edema and fluid.  No stones identified.  The mid common duct measures 1.3 cm on transverse image 16/series 5 and image 10/series 700. This is newly enlarged since 12/23/2009. There is no evidence of choledocholithiasis. In the region of the possible pancreatic head mass, the duct undergoes a mild transition, measuring 9 mm in the region of the preampullary pancreatic uncinate process on image 18/series 5.  This portion of the duct is similar to on the 12/23/2009 exam.  There is moderate pancreatic atrophy and mild pancreatic ductal dilatation.  The  ductal dilatation undergoes a transition in the region of the pancreatic head, including on images 17/series 5 and image 68/series 7.  In this region, there is a subtle area of postcontrast hypoenhancement suspected which measures 1.5 x 2.0 cm on image 58/series 11,302.  Possible restricted diffusion in this area on image 6/series 900.  The vascularity is suboptimally evaluated secondary motion.  There is a separate origin of the celiac axis and common hepatic artery. The superior mesenteric vein is possibly contacted by the pancreatic head lesion on image 58/series 1302.  No circumferential contact.  No abdominal adenopathy or ascites.  IMPRESSION:  1.  Findings which remain suspicious for acalculous cholecystitis. 2.  Motion degraded exam.  This degrades evaluation of the pancreatic head. 3.  Combination of subtle findings, including transition from dilated to normal caliber pancreatic and common duct in the region of the pancreatic head.  Cannot exclude underlying non border deforming hypoenhancing pancreatic lesion (i.e. adenocarcinoma). A benign stricture could look similar.  Potential clinical strategies include further evaluation with ERCP and possible endoscopic ultrasound.  If the patient is not a good ERCP candidate, follow-up cross-sectional imaging with dedicated pancreatic protocol CT or MRI could be performed when the patient is clinically stable and able to hold breath ( i.e. as an outpatient). 4.  If the patient is eventually diagnosed with pancreatic adenocarcinoma via endoscopic ultrasound, and surgery is a consideration, dedicated pancreatic protocol CT should be considered to entirely evaluate the superior mesenteric vein, given motion limitation on the current exam.   Original Report Authenticated By: Jeronimo Greaves, M.D.    Microbiology: Recent Results (from the past 240 hour(s))  CULTURE, BLOOD (ROUTINE X 2)     Status: None   Collection Time    10/27/12  1:51 PM      Result Value Range  Status   Specimen Description BLOOD ARM RIGHT  Final   Special Requests BOTTLES DRAWN AEROBIC AND ANAEROBIC 10CC   Final   Culture  Setup Time 10/27/2012 23:43   Final   Culture     Final   Value:        BLOOD CULTURE RECEIVED NO GROWTH TO DATE CULTURE WILL BE HELD FOR 5 DAYS BEFORE ISSUING A FINAL NEGATIVE REPORT   Report Status PENDING   Incomplete  CULTURE, BLOOD (ROUTINE X 2)     Status: None   Collection Time    10/27/12  1:59 PM      Result Value Range Status   Specimen Description BLOOD HAND RIGHT   Final   Special Requests     Final   Value: BOTTLES DRAWN AEROBIC AND ANAEROBIC 10CCBLUE 9CCRED   Culture  Setup Time 10/27/2012 23:43   Final   Culture     Final   Value: ESCHERICHIA COLI     Note: Gram Stain Report Called to,Read Back By and Verified With: VALENCIA MCKNIGHT 10/28/12 @ 5:23PM BY RUSCA.   Report Status 10/30/2012 FINAL   Final   Organism ID, Bacteria ESCHERICHIA COLI   Final     Labs: Basic Metabolic Panel:  Recent Labs Lab 10/26/12 0949 10/27/12 1209 10/28/12 0630 10/29/12 0558 10/30/12 0625  NA 137 134* 137 136 137  K 3.7 3.3* 3.0* 3.5 4.3  CL 100 97 101 99 100  CO2 30 26 25 24 26   GLUCOSE 116* 226* 185* 111* 101*  BUN 22 18 10 6  4*  CREATININE 0.9 0.68 0.61 0.49* 0.57  CALCIUM 8.9 8.7 8.4 9.0 9.0  MG  --   --  1.5  --   --    Liver Function Tests:  Recent Labs Lab 10/26/12 0949 10/27/12 1233 10/28/12 0630 10/29/12 0558 10/30/12 0625  AST 57* 186* 163* 64* 93*  ALT 140* 241* 209* 162* 163*  ALKPHOS 239* 369* 373* 386* 510*  BILITOT 1.4* 1.6* 2.4* 1.8* 3.9*  PROT 6.5 6.2 5.6* 5.8* 5.9*  ALBUMIN 3.5 3.2* 2.6* 2.6* 2.7*    Recent Labs Lab 10/27/12 1233 10/29/12 0558  LIPASE 80* 49   No results found for this basename: AMMONIA,  in the last 168 hours CBC:  Recent Labs Lab 10/26/12 0949 10/27/12 1209 10/28/12 0630 10/29/12 0558  WBC 7.3 9.3 4.8 3.7*  NEUTROABS 5.7 8.3*  --  2.1  HGB 14.5 13.5 12.2 12.5  HCT 43.0 38.3 35.7*  36.3  MCV 92.2 87.6 88.4 88.1  PLT 150.0 122* 117* 129*   Cardiac Enzymes: No results found for this basename: CKTOTAL, CKMB, CKMBINDEX, TROPONINI,  in the last 168 hours BNP: BNP (last 3 results) No results found for this basename: PROBNP,  in the last 8760 hours CBG:  Recent Labs Lab 10/30/12 0002 10/30/12 0422 10/30/12 0740 10/30/12 1234 10/30/12 1650  GLUCAP 141* 91 117* 162* 84   Signed:  Niylah Hassan L  [facility] 10/30/2012, 6:19 PM

## 2012-10-30 NOTE — Progress Notes (Signed)
Patient ID: [name] [name], female   DOB: Feb 21, 1946, 67 y.o.   MRN: [id number]    Subjective: Pt still having the same upper abdominal pain.  Ate breakfast well this morning.  Objective: Vital signs in last 24 hours: Temp:  [98.6 F (37 C)-99.2 F (37.3 C)] 99.2 F (37.3 C) (05/19 0421) Pulse Rate:  [66-80] 80 (05/19 0421) Resp:  [18] 18 (05/19 0421) BP: (126-175)/(61-74) 159/67 mmHg (05/19 0421) SpO2:  [92 %-99 %] 92 % (05/19 0421) Weight:  [113 lb (51.256 kg)] 113 lb (51.256 kg) (05/18 2125) Last BM Date: 10/29/12  Intake/Output from previous day: 05/18 0701 - 05/19 0700 In: 2169.2 [P.O.:480; I.V.:1539.2; IV Piggyback:150] Out: -  Intake/Output this shift:    PE: Abd: soft, mildly tender in upper abdomen, +BS, ND Heart: regular Lungs: CTAB  Lab Results:   Recent Labs  10/28/12 0630 10/29/12 0558  WBC 4.8 3.7*  HGB 12.2 12.5  HCT 35.7* 36.3  PLT 117* 129*   BMET  Recent Labs  10/29/12 0558 10/30/12 0625  NA 136 137  K 3.5 4.3  CL 99 100  CO2 24 26  GLUCOSE 111* 101*  BUN 6 4*  CREATININE 0.49* 0.57  CALCIUM 9.0 9.0   PT/INR  Recent Labs  10/29/12 0558  LABPROT 12.4  INR 0.93   CMP     Component Value Date/Time   NA 137 10/30/2012 0625   K 4.3 10/30/2012 0625   CL 100 10/30/2012 0625   CO2 26 10/30/2012 0625   GLUCOSE 101* 10/30/2012 0625   BUN 4* 10/30/2012 0625   CREATININE 0.57 10/30/2012 0625   CALCIUM 9.0 10/30/2012 0625   PROT 5.9* 10/30/2012 0625   ALBUMIN 2.7* 10/30/2012 0625   AST 93* 10/30/2012 0625   ALT 163* 10/30/2012 0625   ALKPHOS 510* 10/30/2012 0625   BILITOT 3.9* 10/30/2012 0625   GFRNONAA >90 10/30/2012 0625   GFRAA >90 10/30/2012 0625   Lipase     Component Value Date/Time   LIPASE 49 10/29/2012 0558       Studies/Results: Mr 3d Recon At Scanner  10/29/2012   *RADIOLOGY REPORT*  Clinical Data:  Gastric bypass 4 years ago.  24 by weight loss over last several months.  Hypertension.  Diabetes.  Elevated liver function tests.   MRI ABDOMEN WITHOUT AND WITH CONTRAST (INCLUDING MRCP)  Technique:  Multiplanar multisequence MR imaging of the abdomen was performed both before and after the administration of intravenous contrast. Heavily T2-weighted images of the biliary and pancreatic ducts were obtained, and three-dimensional MRCP images were rendered by post processing.  Contrast: 10mL MULTIHANCE GADOBENATE DIMEGLUMINE 529 MG/ML IV SOLN  Comparison:  The CT of 10/27/2012.  Ultrasound of 10/27/2012.  Findings:  Portions of exam are mildly motion degraded. Normal heart size without pericardial or pleural effusion.  No focal liver lesion. Arterial heterogeneous hepatic enhancement is favored to be due to hyperemia from the inflamed gallbladder.  Mild intrahepatic biliary ductal dilatation, with the left hepatic duct measuring 7 mm on image 11/series 5. New since 2011.  Normal spleen.  Status post gastric bypass.  The gallbladder remains dilated with wall thickening and mucosal hyperenhancement. There is mild pericystic edema and fluid.  No stones identified.  The mid common duct measures 1.3 cm on transverse image 16/series 5 and image 10/series 700. This is newly enlarged since 12/23/2009. There is no evidence of choledocholithiasis. In the region of the possible pancreatic head mass, the duct undergoes a mild transition, measuring 9  mm in the region of the preampullary pancreatic uncinate process on image 18/series 5.  This portion of the duct is similar to on the 12/23/2009 exam.  There is moderate pancreatic atrophy and mild pancreatic ductal dilatation.  The ductal dilatation undergoes a transition in the region of the pancreatic head, including on images 17/series 5 and image 68/series 7.  In this region, there is a subtle area of postcontrast hypoenhancement suspected which measures 1.5 x 2.0 cm on image 58/series 11,302.  Possible restricted diffusion in this area on image 6/series 900.  The vascularity is suboptimally evaluated  secondary motion.  There is a separate origin of the celiac axis and common hepatic artery. The superior mesenteric vein is possibly contacted by the pancreatic head lesion on image 58/series 1302.  No circumferential contact.  No abdominal adenopathy or ascites.  IMPRESSION:  1.  Findings which remain suspicious for acalculous cholecystitis. 2.  Motion degraded exam.  This degrades evaluation of the pancreatic head. 3.  Combination of subtle findings, including transition from dilated to normal caliber pancreatic and common duct in the region of the pancreatic head.  Cannot exclude underlying non border deforming hypoenhancing pancreatic lesion (i.e. adenocarcinoma). A benign stricture could look similar.  Potential clinical strategies include further evaluation with ERCP and possible endoscopic ultrasound.  If the patient is not a good ERCP candidate, follow-up cross-sectional imaging with dedicated pancreatic protocol CT or MRI could be performed when the patient is clinically stable and able to hold breath ( i.e. as an outpatient). 4.  If the patient is eventually diagnosed with pancreatic adenocarcinoma via endoscopic ultrasound, and surgery is a consideration, dedicated pancreatic protocol CT should be considered to entirely evaluate the superior mesenteric vein, given motion limitation on the current exam.   Original Report Authenticated By: Jeronimo Greaves, M.D.   Mr Abd W/wo Cm/mrcp  10/29/2012   *RADIOLOGY REPORT*  Clinical Data:  Gastric bypass 4 years ago.  25 by weight loss over last several months.  Hypertension.  Diabetes.  Elevated liver function tests.  MRI ABDOMEN WITHOUT AND WITH CONTRAST (INCLUDING MRCP)  Technique:  Multiplanar multisequence MR imaging of the abdomen was performed both before and after the administration of intravenous contrast. Heavily T2-weighted images of the biliary and pancreatic ducts were obtained, and three-dimensional MRCP images were rendered by post processing.   Contrast: 10mL MULTIHANCE GADOBENATE DIMEGLUMINE 529 MG/ML IV SOLN  Comparison:  The CT of 10/27/2012.  Ultrasound of 10/27/2012.  Findings:  Portions of exam are mildly motion degraded. Normal heart size without pericardial or pleural effusion.  No focal liver lesion. Arterial heterogeneous hepatic enhancement is favored to be due to hyperemia from the inflamed gallbladder.  Mild intrahepatic biliary ductal dilatation, with the left hepatic duct measuring 7 mm on image 11/series 5. New since 2011.  Normal spleen.  Status post gastric bypass.  The gallbladder remains dilated with wall thickening and mucosal hyperenhancement. There is mild pericystic edema and fluid.  No stones identified.  The mid common duct measures 1.3 cm on transverse image 16/series 5 and image 10/series 700. This is newly enlarged since 12/23/2009. There is no evidence of choledocholithiasis. In the region of the possible pancreatic head mass, the duct undergoes a mild transition, measuring 9 mm in the region of the preampullary pancreatic uncinate process on image 18/series 5.  This portion of the duct is similar to on the 12/23/2009 exam.  There is moderate pancreatic atrophy and mild pancreatic ductal dilatation.  The ductal dilatation  undergoes a transition in the region of the pancreatic head, including on images 17/series 5 and image 68/series 7.  In this region, there is a subtle area of postcontrast hypoenhancement suspected which measures 1.5 x 2.0 cm on image 58/series 11,302.  Possible restricted diffusion in this area on image 6/series 900.  The vascularity is suboptimally evaluated secondary motion.  There is a separate origin of the celiac axis and common hepatic artery. The superior mesenteric vein is possibly contacted by the pancreatic head lesion on image 58/series 1302.  No circumferential contact.  No abdominal adenopathy or ascites.  IMPRESSION:  1.  Findings which remain suspicious for acalculous cholecystitis. 2.  Motion  degraded exam.  This degrades evaluation of the pancreatic head. 3.  Combination of subtle findings, including transition from dilated to normal caliber pancreatic and common duct in the region of the pancreatic head.  Cannot exclude underlying non border deforming hypoenhancing pancreatic lesion (i.e. adenocarcinoma). A benign stricture could look similar.  Potential clinical strategies include further evaluation with ERCP and possible endoscopic ultrasound.  If the patient is not a good ERCP candidate, follow-up cross-sectional imaging with dedicated pancreatic protocol CT or MRI could be performed when the patient is clinically stable and able to hold breath ( i.e. as an outpatient). 4.  If the patient is eventually diagnosed with pancreatic adenocarcinoma via endoscopic ultrasound, and surgery is a consideration, dedicated pancreatic protocol CT should be considered to entirely evaluate the superior mesenteric vein, given motion limitation on the current exam.   Original Report Authenticated By: Jeronimo Greaves, M.D.    Anti-infectives: Anti-infectives   Start     Dose/Rate Route Frequency Ordered Stop   10/28/12 1400  vancomycin (VANCOCIN) IVPB 1000 mg/200 mL premix  Status:  Discontinued     1,000 mg 200 mL/hr over 60 Minutes Intravenous Every 24 hours 10/28/12 1322 10/28/12 1813   10/27/12 2200  cefoTEtan (CEFOTAN) 1 g in dextrose 5 % 50 mL IVPB     1 g 100 mL/hr over 30 Minutes Intravenous Every 12 hours 10/27/12 2158         Assessment/Plan  1. 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 ? Acalculous cholecystitis 3. Ventral hernia 4. Bacteremia  Plan: 1. Spoke with GI, they are going to try and get her transferred to [facility] for further workup including ERCP that can be done in bypass patients. 2. The need for a cholecystectomy is still pending.   3. Further workup per GI and primary   LOS: 3 days    Arneda Sappington E 10/30/2012, 9:23 AM Pager: [phone]

## 2012-10-30 NOTE — Progress Notes (Signed)
[facility] Daily Rounding Note 10/30/2012, 8:37 AM  SUBJECTIVE:       Some arthritic pain and some upper abdominal pain.  No nausea with FL diet.    OBJECTIVE:         Vital signs in last 24 hours:    Temp:  [98.6 F (37 C)-99.2 F (37.3 C)] 99.2 F (37.3 C) (05/19 0421) Pulse Rate:  [66-80] 80 (05/19 0421) Resp:  [18] 18 (05/19 0421) BP: (126-175)/(61-74) 159/67 mmHg (05/19 0421) SpO2:  [92 %-99 %] 92 % (05/19 0421) Weight:  [51.256 kg (113 lb)] 51.256 kg (113 lb) (05/18 2125) Last BM Date: 10/29/12 General: not acutely ill appearing   Heart: RRR.  No MRG Chest: clear B.  No resp distress or cough, voice hoarse Abdomen: soft, tender in RUQ and epigastrium.  Extremities: no CCE Neuro/Psych:  Pleasant, oriented x 3.  Slightly drowsy from recent Dilaudid  Intake/Output from previous day: 05/18 0701 - 05/19 0700 In: 2169.2 [P.O.:480; I.V.:1539.2; IV Piggyback:150] Out: -   Intake/Output this shift:    Lab Results:  Recent Labs  10/27/12 1209 10/28/12 0630 10/29/12 0558  WBC 9.3 4.8 3.7*  HGB 13.5 12.2 12.5  HCT 38.3 35.7* 36.3  PLT 122* 117* 129*   BMET  Recent Labs  10/28/12 0630 10/29/12 0558 10/30/12 0625  NA 137 136 137  K 3.0* 3.5 4.3  CL 101 99 100  CO2 25 24 26   GLUCOSE 185* 111* 101*  BUN 10 6 4*  CREATININE 0.61 0.49* 0.57  CALCIUM 8.4 9.0 9.0   LFT  Recent Labs  10/27/12 1233 10/28/12 0630 10/29/12 0558 10/30/12 0625  PROT 6.2 5.6* 5.8* 5.9*  ALBUMIN 3.2* 2.6* 2.6* 2.7*  AST 186* 163* 64* 93*  ALT 241* 209* 162* 163*  ALKPHOS 369* 373* 386* 510*  BILITOT 1.6* 2.4* 1.8* 3.9*  BILIDIR 0.7*  --   --   --   IBILI 0.9  --   --   --    PT/INR  Recent Labs  10/29/12 0558  LABPROT 12.4  INR 0.93   Hepatitis Panel No results found for this basename: HEPBSAG, HCVAB, HEPAIGM, HEPBIGM,  in the last 72 hours  Studies/Results:  Mr Abd W/wo Cm/mrcp 10/29/2012     Findings:  Portions of exam are mildly motion degraded. Normal heart  size without pericardial or pleural effusion.  No focal liver lesion. Arterial heterogeneous hepatic enhancement is favored to be due to hyperemia from the inflamed gallbladder.  Mild intrahepatic biliary ductal dilatation, with the left hepatic duct measuring 7 mm on image 11/series 5. New since 2011.  Normal spleen.  Status post gastric bypass.  The gallbladder remains dilated with wall thickening and mucosal hyperenhancement. There is mild pericystic edema and fluid.  No stones identified.  The mid common duct measures 1.3 cm on transverse image 16/series 5 and image 10/series 700. This is newly enlarged since 12/23/2009. There is no evidence of choledocholithiasis. In the region of the possible pancreatic head mass, the duct undergoes a mild transition, measuring 9 mm in the region of the preampullary pancreatic uncinate process on image 18/series 5.  This portion of the duct is similar to on the 12/23/2009 exam.  There is moderate pancreatic atrophy and mild pancreatic ductal dilatation.  The ductal dilatation undergoes a transition in the region of the pancreatic head, including on images 17/series 5 and image 68/series 7.  In this region, there is a subtle area of postcontrast hypoenhancement  suspected which measures 1.5 x 2.0 cm on image 58/series 11,302.  Possible restricted diffusion in this area on image 6/series 900.  The vascularity is suboptimally evaluated secondary motion.  There is a separate origin of the celiac axis and common hepatic artery. The superior mesenteric vein is possibly contacted by the pancreatic head lesion on image 58/series 1302.  No circumferential contact.  No abdominal adenopathy or ascites.  IMPRESSION:  1.  Findings which remain suspicious for acalculous cholecystitis. 2.  Motion degraded exam.  This degrades evaluation of the pancreatic head. 3.  Combination of subtle findings, including transition from dilated to normal caliber pancreatic and common duct in the region of  the pancreatic head.  Cannot exclude underlying non border deforming hypoenhancing pancreatic lesion (i.e. adenocarcinoma). A benign stricture could look similar.  Potential clinical strategies include further evaluation with ERCP and possible endoscopic ultrasound.  If the patient is not a good ERCP candidate, follow-up cross-sectional imaging with dedicated pancreatic protocol CT or MRI could be performed when the patient is clinically stable and able to hold breath ( i.e. as an outpatient). 4.  If the patient is eventually diagnosed with pancreatic adenocarcinoma via endoscopic ultrasound, and surgery is a consideration, dedicated pancreatic protocol CT should be considered to entirely evaluate the superior mesenteric vein, given motion limitation on the current exam.   Original Report Authenticated By: Jeronimo Greaves, M.D.    ASSESMENT: *  Abnormal LFTs without obstructive pattern, not yet jaundiced, though bilirubin is rising.  Biliary and pancreatic (this latter is chronic) duct dilation.  Hypoattenuated lesion at head of pancreas in region of ductal transition, suspicious for malignancy.   *  ? Cholecystitis.  On ABX:  Cefotan.  *  Ecoli in blood clx.  *  Roux-en Y bypass 2010.  Makes ERCP difficult.  *  Rhematoid arthritis, on Remicade and MTX.  Chronic musculoskeletal pain.    PLAN: *  Transfer to tertiary care hospital for ERCP, EUS... Have d/w Dr Mike Gip (biliary MD at [facility]: [phone]), their preference is to see pt as outpt, discuss the procedure which would then be done the same day.  She feels this can be arranged for this week.  [facility]/biliary scheduler will call me.  Pt and her husband understand and are agreeable to this arrangement.  Will need to have moderate level control of pain with oral meds before she can discharge home until seen at [facility].   *  Dr Lendell Caprice plans to keep inpt until abx sensitivities return.  She will manage anelgesia *  Advance diet.   Addendum 1530  PM Per [facility] biliary pt coordinator Candice [phone]): Pt set up to see GI MD at 0900 tomorrow, at 1 PM will undergo preop anesthesia eval.  ERCP itself is on for 5/21.   Mrs Mattila and her husband aware of arrangements.  Dr Lendell Caprice will coordinate the faxing of necessary paper work to the GI division.  I alerted her of the arrangements. She will discharge on Augmentin for E coli bacteremia. Oral pain control with oxycodone.    LOS: 3 days   Jennye Moccasin  10/30/2012, 8:37 AM Pager: [phone]    GI ATTENDING  The patient's case was reviewed in morning report. X-rays reviewed. Patient personally seen and examined. Husband in room. Agree with interval H&P as outlined above. Patient has biliary obstruction with gram-negative bacteremia. Now stable on antibiotics and doing well. Imaging is concerning for pancreatic cancer. Surgically altered anatomy  precludes standard ERCP and EUS would be quite difficult. Agree with plans for tertiary referral as already arranged above. Discussed with patient and her husband. She will remain on antibiotics.  Wilhemina Bonito. Eda Keys., M.D. [facility] Division of Gastroenterology

## 2012-10-30 NOTE — Progress Notes (Signed)
Seen together and I drew her a diagram to explain the anatomy of her issue Appreciate GI input Patient examined and I agree with the assessment and plan  Violeta Gelinas, MD, MPH, FACS Pager: [phone]  10/30/2012 8:30 AM

## 2012-11-02 LAB — CULTURE, BLOOD (ROUTINE X 2): Culture: NO GROWTH

## 2012-11-06 ENCOUNTER — Encounter: Payer: Self-pay | Admitting: Family Medicine

## 2012-11-10 ENCOUNTER — Encounter: Payer: Self-pay | Admitting: Family Medicine

## 2012-11-27 ENCOUNTER — Ambulatory Visit: Payer: Medicare Other | Admitting: Family Medicine

## 2013-01-19 ENCOUNTER — Telehealth: Payer: Self-pay

## 2013-01-19 NOTE — Telephone Encounter (Signed)
Pt left v/m she was at [facility] today to have stitches removed and stitches healing but request topical cream with numbing med in it where bra rubbing at place of stitches. to [facility]. Left v/m for more info.

## 2013-01-19 NOTE — Telephone Encounter (Signed)
I don't know what we could use for that - keep a non stick gauze pad between bra and wound - and I will forward to her PCP as well

## 2013-01-21 MED ORDER — LIDOCAINE 5 % EX OINT: TOPICAL_OINTMENT | CUTANEOUS | Status: AC | PRN

## 2013-01-21 NOTE — Telephone Encounter (Addendum)
Sent in and called patient - left message.  Recommended start with gauze between bra and stitches.

## 2013-02-02 ENCOUNTER — Encounter: Payer: Self-pay | Admitting: Family Medicine

## 2013-02-02 ENCOUNTER — Ambulatory Visit (INDEPENDENT_AMBULATORY_CARE_PROVIDER_SITE_OTHER): Payer: Medicare Other | Admitting: Family Medicine

## 2013-02-02 ENCOUNTER — Telehealth: Payer: Self-pay

## 2013-02-02 VITALS — BP 120/70 | HR 92 | Temp 98.2°F | Wt 107.5 lb

## 2013-02-02 DIAGNOSIS — T8149XA Infection following a procedure, other surgical site, initial encounter: Secondary | ICD-10-CM | POA: Insufficient documentation

## 2013-02-02 DIAGNOSIS — K869 Disease of pancreas, unspecified: Secondary | ICD-10-CM

## 2013-02-02 DIAGNOSIS — R5381 Other malaise: Secondary | ICD-10-CM

## 2013-02-02 DIAGNOSIS — T8140XA Infection following a procedure, unspecified, initial encounter: Secondary | ICD-10-CM

## 2013-02-02 DIAGNOSIS — R531 Weakness: Secondary | ICD-10-CM

## 2013-02-02 DIAGNOSIS — K8689 Other specified diseases of pancreas: Secondary | ICD-10-CM

## 2013-02-02 MED ORDER — CEPHALEXIN 500 MG PO CAPS: 500.0000 mg | ORAL_CAPSULE | Freq: Three times a day (TID) | ORAL | Status: AC

## 2013-02-02 NOTE — Telephone Encounter (Signed)
Boyd Kerbs from [facility] called and will deliver hospital bed this afternoon for pt. Boyd Kerbs is faxing recommendations of what documentation needs to be in chart for insurance to assist with cost.

## 2013-02-02 NOTE — Assessment & Plan Note (Signed)
Postop weakness. I have provided script for temporary rolling walker and hospital bed.

## 2013-02-02 NOTE — Assessment & Plan Note (Addendum)
Mild.  Start keflex 500mg  tid x 3 days. To [facility] if any worsening (fever, draining pus, worsening abd pain) F/u next week with surgeons for recheck.

## 2013-02-02 NOTE — Patient Instructions (Addendum)
I want you to start keflex antibiotic three times daily for skin.  Sent to pharmacy. Pass by Marion's office to schedule appointment with [facility] surgeons next week to recheck wound. We will fax prescription for hospital bed and rolling walker over to [facility] today. Continue antibiotic ointment on wound. If worsening over the weekend, please seek urgent care.

## 2013-02-02 NOTE — Telephone Encounter (Signed)
Dr Sharen Hones wrote order for hospital bed and rolling walker to be faxed to [facility] [phone]. Done with confirmation fax went thru. Sent for scanning.
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
## 2013-02-02 NOTE — Assessment & Plan Note (Signed)
Pending pathology after whipple.  ?pancreatic adeno vs periampullary adeno. Has f/u with onc 02/16/2013. Appetite improved, weight gain.

## 2013-02-08 ENCOUNTER — Other Ambulatory Visit: Payer: Self-pay | Admitting: Family Medicine

## 2013-02-28 ENCOUNTER — Other Ambulatory Visit: Payer: Self-pay | Admitting: Family Medicine

## 2013-02-28 NOTE — Telephone Encounter (Signed)
Rx called in as directed.   

## 2013-02-28 NOTE — Telephone Encounter (Signed)
plz phone in. 

## 2013-02-28 NOTE — Telephone Encounter (Signed)
Ok to refill 

## 2013-03-14 ENCOUNTER — Other Ambulatory Visit: Payer: Self-pay | Admitting: Family Medicine

## 2013-03-19 ENCOUNTER — Other Ambulatory Visit: Payer: Self-pay | Admitting: Family Medicine

## 2013-03-19 NOTE — Telephone Encounter (Signed)
Ok to refill 

## 2013-03-25 ENCOUNTER — Other Ambulatory Visit: Payer: Self-pay | Admitting: Family Medicine

## 2013-03-25 DIAGNOSIS — K8689 Other specified diseases of pancreas: Secondary | ICD-10-CM

## 2013-03-25 DIAGNOSIS — I10 Essential (primary) hypertension: Secondary | ICD-10-CM

## 2013-03-25 DIAGNOSIS — Z8639 Personal history of other endocrine, nutritional and metabolic disease: Secondary | ICD-10-CM

## 2013-03-26 ENCOUNTER — Other Ambulatory Visit (INDEPENDENT_AMBULATORY_CARE_PROVIDER_SITE_OTHER): Payer: Medicare Other

## 2013-03-26 ENCOUNTER — Encounter: Payer: Self-pay | Admitting: Radiology

## 2013-03-26 DIAGNOSIS — I10 Essential (primary) hypertension: Secondary | ICD-10-CM

## 2013-03-26 DIAGNOSIS — K8689 Other specified diseases of pancreas: Secondary | ICD-10-CM

## 2013-03-26 DIAGNOSIS — Z862 Personal history of diseases of the blood and blood-forming organs and certain disorders involving the immune mechanism: Secondary | ICD-10-CM

## 2013-03-26 DIAGNOSIS — K869 Disease of pancreas, unspecified: Secondary | ICD-10-CM

## 2013-03-26 DIAGNOSIS — Z8639 Personal history of other endocrine, nutritional and metabolic disease: Secondary | ICD-10-CM

## 2013-03-26 LAB — CBC WITH DIFFERENTIAL/PLATELET
Basophils Absolute: 0 10*3/uL (ref 0.0–0.1)
Eosinophils Relative: 1.9 % (ref 0.0–5.0)
HCT: 36.9 % (ref 36.0–46.0)
Lymphs Abs: 0.7 10*3/uL (ref 0.7–4.0)
MCHC: 33.1 g/dL (ref 30.0–36.0)
MCV: 86.9 fl (ref 78.0–100.0)
Monocytes Absolute: 0.4 10*3/uL (ref 0.1–1.0)
Monocytes Relative: 6.6 % (ref 3.0–12.0)
Neutro Abs: 4.2 10*3/uL (ref 1.4–7.7)
Neutrophils Relative %: 78.5 % — ABNORMAL HIGH (ref 43.0–77.0)
Platelets: 223 10*3/uL (ref 150.0–400.0)
RDW: 15.8 % — ABNORMAL HIGH (ref 11.5–14.6)
WBC: 5.4 10*3/uL (ref 4.5–10.5)

## 2013-03-26 LAB — COMPREHENSIVE METABOLIC PANEL
ALT: 12 U/L (ref 0–35)
AST: 20 U/L (ref 0–37)
Albumin: 3.2 g/dL — ABNORMAL LOW (ref 3.5–5.2)
CO2: 30 mEq/L (ref 19–32)
Creatinine, Ser: 0.7 mg/dL (ref 0.4–1.2)
GFR: 86.92 mL/min (ref >60.00)
Glucose, Bld: 186 mg/dL — ABNORMAL HIGH (ref 70–99)
Potassium: 4 mEq/L (ref 3.5–5.1)
Sodium: 139 mEq/L (ref 135–145)
Total Bilirubin: 0.8 mg/dL (ref 0.3–1.2)
Total Protein: 6.3 g/dL (ref 6.0–8.3)

## 2013-03-26 LAB — HEMOGLOBIN A1C: Hgb A1c MFr Bld: 5.7 % (ref 4.6–6.5)

## 2013-03-26 LAB — MICROALBUMIN / CREATININE URINE RATIO: Microalb, Ur: 7.4 mg/dL — ABNORMAL HIGH (ref 0.0–1.9)

## 2013-03-27 ENCOUNTER — Encounter: Payer: Self-pay | Admitting: Family Medicine

## 2013-03-27 ENCOUNTER — Encounter: Payer: Self-pay | Admitting: *Deleted

## 2013-03-28 ENCOUNTER — Ambulatory Visit (INDEPENDENT_AMBULATORY_CARE_PROVIDER_SITE_OTHER): Payer: Medicare Other

## 2013-03-28 DIAGNOSIS — Z23 Encounter for immunization: Secondary | ICD-10-CM

## 2013-04-12 ENCOUNTER — Encounter: Payer: Self-pay | Admitting: Family Medicine

## 2013-04-20 ENCOUNTER — Ambulatory Visit (INDEPENDENT_AMBULATORY_CARE_PROVIDER_SITE_OTHER): Payer: Medicare Other | Admitting: Surgery

## 2013-07-15 DEATH — deceased

## 2013-07-24 ENCOUNTER — Telehealth: Payer: Self-pay

## 2013-07-24 NOTE — Telephone Encounter (Signed)
Patient past away per [employer] in [place] [employer] °

## 2015-04-11 IMAGING — CT CT ABD-PELV W/ CM
2 of 5 series · 13 of 32 positions shown, 18 images · IV contrast (water/omni  & 80ml omni 300)
Comparison: 09/05/2012 and 12/23/2009.

CLINICAL DATA: Generalized weakness and malaise.  Elevated liver
function tests.  History of nephrectomy and gastric bypass.

CT ABDOMEN AND PELVIS WITH CONTRAST
TECHNIQUE: Multidetector CT imaging of the abdomen and pelvis was
performed following the standard protocol during bolus
administration of intravenous contrast.
Contrast: 80mL OMNIPAQUE IOHEXOL 300 MG/ML  SOLN

[Series 2: routine abdomen · axial · 0.70mm/px · z∈[-468,-128]mm · 8 of 88 slices shown, 13 images]
[im 10/88  soft-tissue]
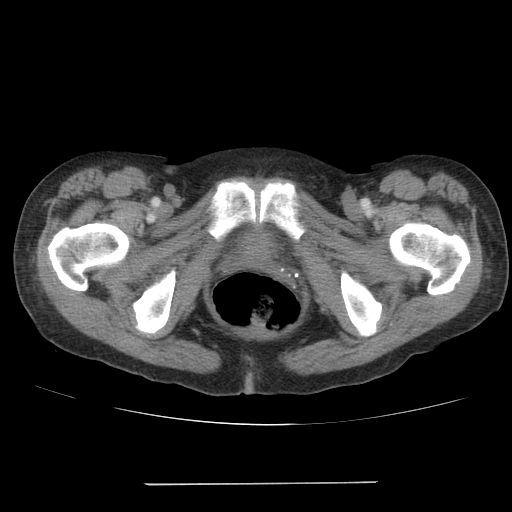
[im 10/88  bone]
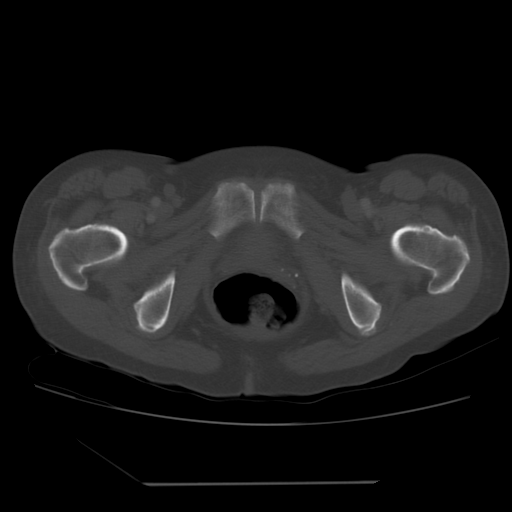
[im 20/88  soft-tissue]
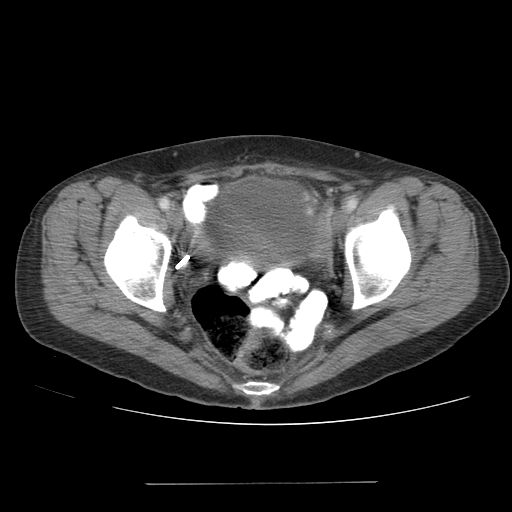
[im 30/88  soft-tissue]
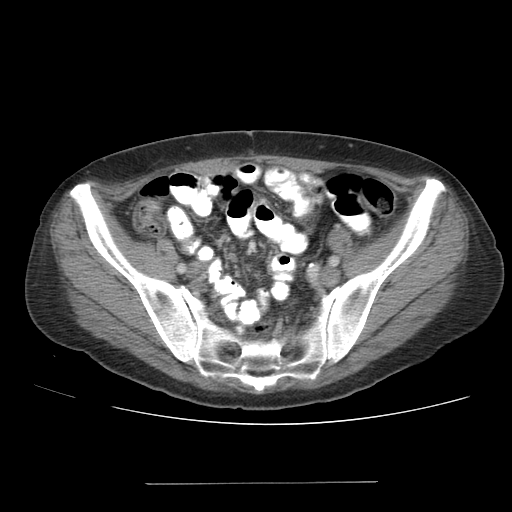
[im 39/88  soft-tissue]
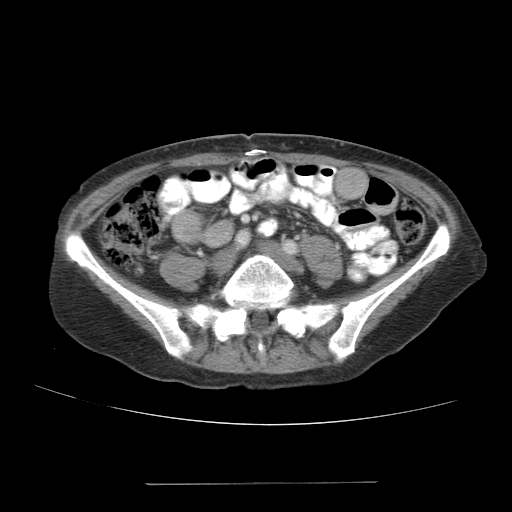
[im 49/88  soft-tissue]
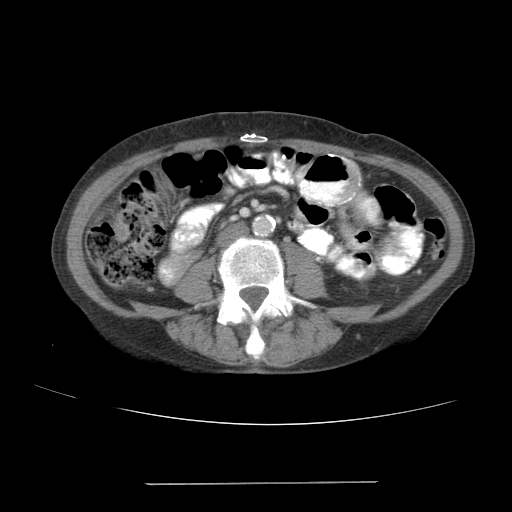
[im 49/88  lung]
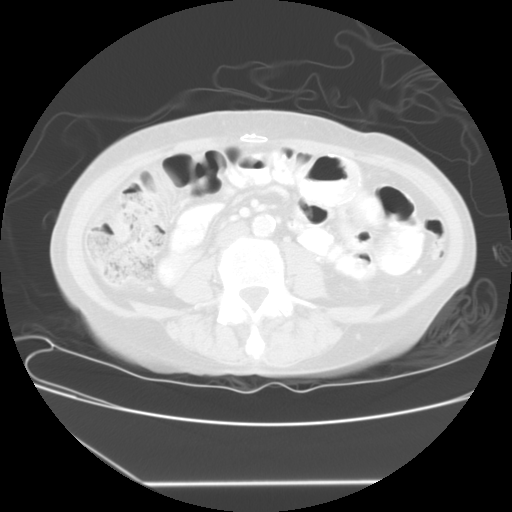
[im 59/88  soft-tissue]
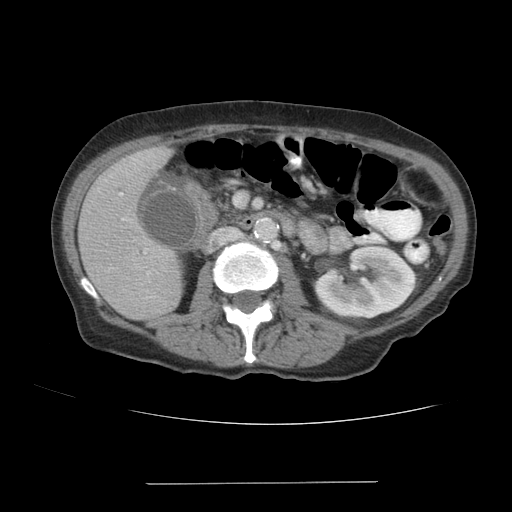
[im 59/88  lung]
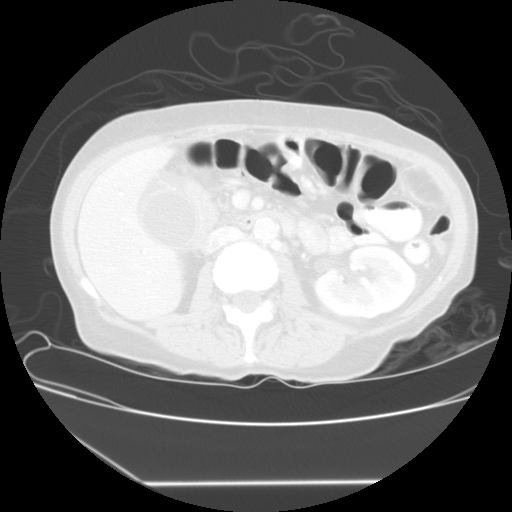
[im 68/88  soft-tissue]
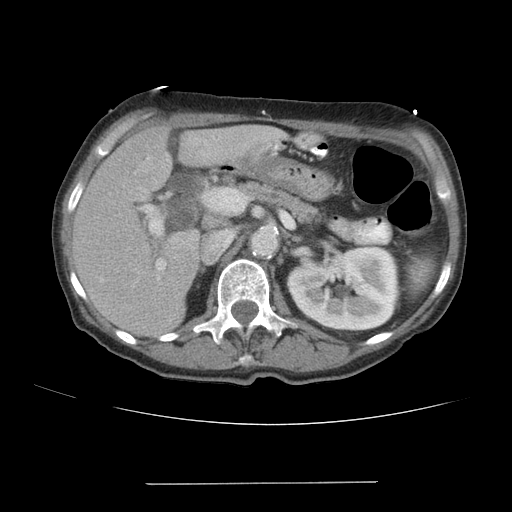
[im 68/88  lung]
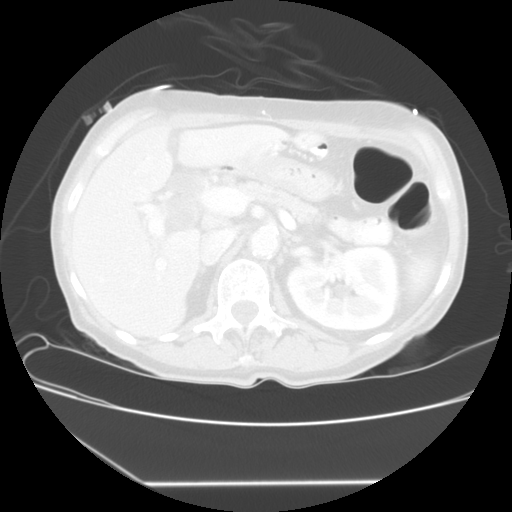
[im 78/88  soft-tissue]
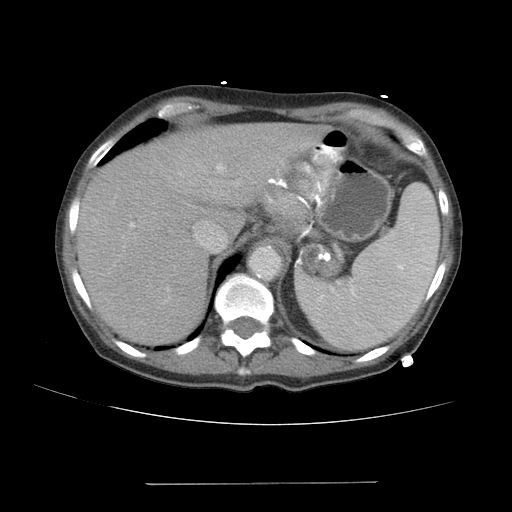
[im 78/88  lung]
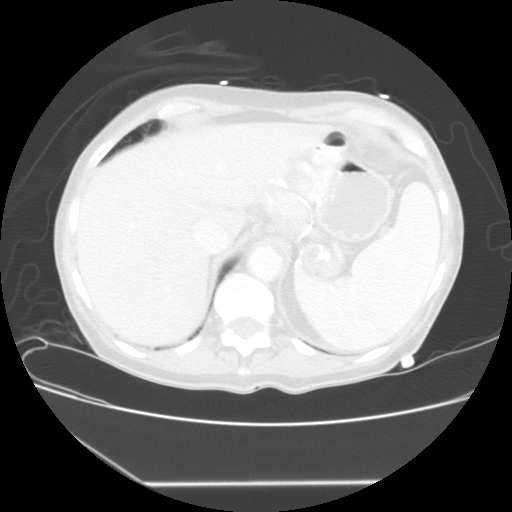

[Series 401: sagittals · sagittal · 0.87mm/px · 5 of 88 slices shown]
[im 11/88  soft-tissue]
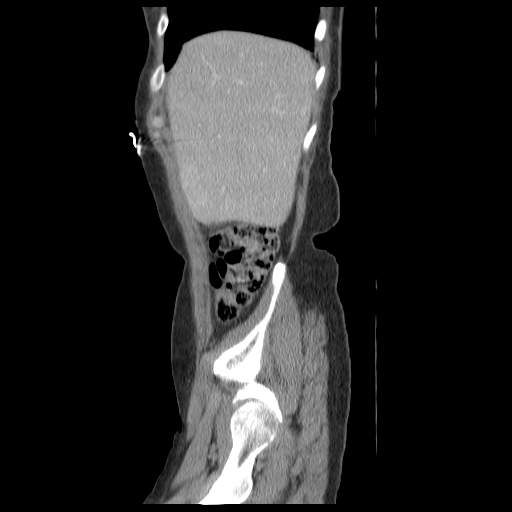
[im 22/88  soft-tissue]
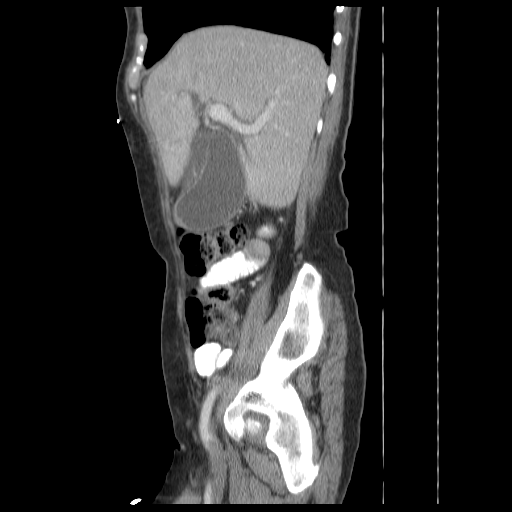
[im 33/88  soft-tissue]
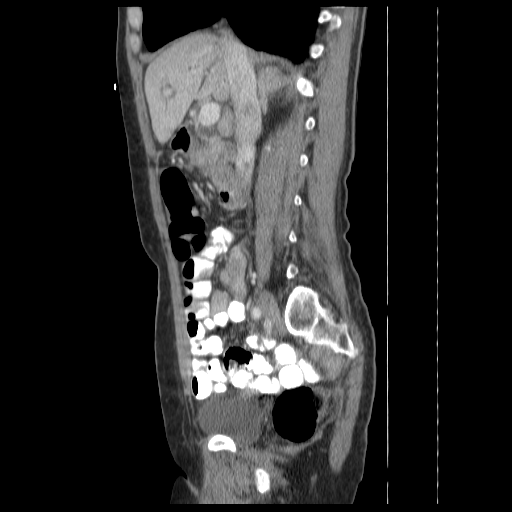
[im 44/88  soft-tissue]
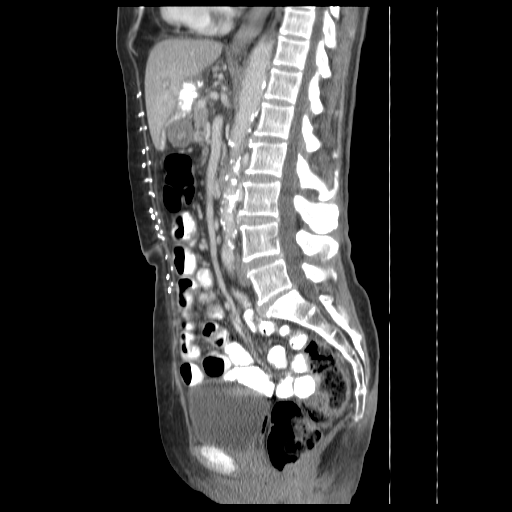
[im 55/88  soft-tissue]
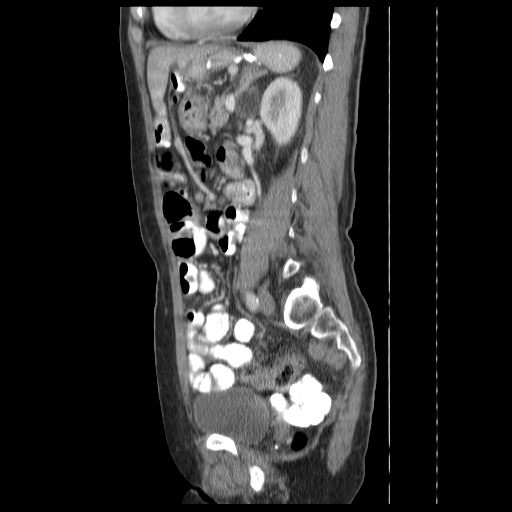

[13 of 32 positions shown; findings below may reference images not displayed]

FINDINGS: Lung bases:  Calcified granuloma at the right lung base.
There is also minimal subpleural nodularity which is unchanged and
favored to be related to subpleural lymph nodes.  Mild nodularity
at the left lung base, also likely related to subpleural lymph
nodes.

Normal heart size without pericardial or pleural effusion.  Small
hiatal hernia.

Abdomen/pelvis:  Suspicion of mild hepatic steatosis, without focal
liver lesion.  Old granulomas disease in the spleen.  Status post
antecolic Roux-en-Y gastric bypass.

Moderate pancreatic atrophy with borderline ductal dilatation
within the body.  Subtle area of hypoattenuation within the
pancreatic head suspected on image 25/series 2.

The intrahepatic ducts are mildly prominent, new.  The common duct
measures 1.2 cm on image 22 coronal.

There is apparent gallbladder wall thickening versus focal
pericholecystic edema. Gallbladder distention.  Example image
29/series 2.  No calcified gallstones.

Separate origins of the splenic artery and common hepatic artery.
Prominent porta hepatis nodes are likely reactive.

Normal right adrenal gland.  A left adrenal nodule measures 2.5 cm
and fat density, consistent with a myelolipoma.  Normal appearance
of the left kidney.  Status post right nephrectomy.

Circumaortic left renal vein. No retroperitoneal or retrocrural
adenopathy.

Normal colon, appendix, and terminal ileum.  Normal caliber of
small bowel loops, without ascites. No pneumatosis or free
intraperitoneal air.  Prior midline laparotomy.

Fat density lesion within the left upper abdominal greater omentum
measures 3.4 cm versus 3.6 cm on the prior. No pelvic adenopathy.
Hysterectomy.  Normal urinary bladder. No adnexal mass or
significant free fluid.  A fat containing left paracentral pelvic
hernia.

Bones/Musculoskeletal:  No acute osseous abnormality.  Degenerative
disc disease at the lumbosacral junction on the right.
IMPRESSION: 1.  Findings highly suspicious for acute cholecystitis. Depending
on clinical concern, ultrasound could confirm.
2.  Subtle pancreatic findings for which pancreatic head
adenocarcinoma cannot be excluded.  When the patient is improved
from the acute clinical situation, dedicated pre and post contrast
abdominal MRI is recommended.
3.  Subtle biliary ductal dilatation.  This could be related to the
pancreatic process or an otherwise occult common duct stone.  This
would also be best evaluated with MRI to include MRCP.
4.  Left upper abdominal fat density lesion which is likely chronic
area of omental infarct.
5.  Left adrenal myelolipoma.
